# Patient Record
Sex: Male | Born: 1935 | Race: White | Hispanic: No | Marital: Married | Smoking: Former smoker
Health system: Southern US, Community
[De-identification: ages and names within clinical notes are randomized; demographics above are authoritative.]

## PROBLEM LIST (undated history)

## (undated) DIAGNOSIS — S82899A Other fracture of unspecified lower leg, initial encounter for closed fracture: Secondary | ICD-10-CM

## (undated) DIAGNOSIS — S8291XA Unspecified fracture of right lower leg, initial encounter for closed fracture: Secondary | ICD-10-CM

## (undated) DIAGNOSIS — K469 Unspecified abdominal hernia without obstruction or gangrene: Secondary | ICD-10-CM

## (undated) DIAGNOSIS — E669 Obesity, unspecified: Secondary | ICD-10-CM

## (undated) DIAGNOSIS — Z7901 Long term (current) use of anticoagulants: Secondary | ICD-10-CM

## (undated) DIAGNOSIS — N183 Chronic kidney disease, stage 3 unspecified: Secondary | ICD-10-CM

## (undated) DIAGNOSIS — I10 Essential (primary) hypertension: Secondary | ICD-10-CM

## (undated) DIAGNOSIS — H409 Unspecified glaucoma: Secondary | ICD-10-CM

## (undated) DIAGNOSIS — E785 Hyperlipidemia, unspecified: Secondary | ICD-10-CM

## (undated) DIAGNOSIS — G4733 Obstructive sleep apnea (adult) (pediatric): Secondary | ICD-10-CM

## (undated) DIAGNOSIS — E1129 Type 2 diabetes mellitus with other diabetic kidney complication: Secondary | ICD-10-CM

## (undated) DIAGNOSIS — Z9889 Other specified postprocedural states: Secondary | ICD-10-CM

## (undated) DIAGNOSIS — H3530 Unspecified macular degeneration: Secondary | ICD-10-CM

## (undated) DIAGNOSIS — R112 Nausea with vomiting, unspecified: Secondary | ICD-10-CM

## (undated) DIAGNOSIS — IMO0002 Reserved for concepts with insufficient information to code with codable children: Secondary | ICD-10-CM

## (undated) DIAGNOSIS — I82409 Acute embolism and thrombosis of unspecified deep veins of unspecified lower extremity: Secondary | ICD-10-CM

## (undated) DIAGNOSIS — M1990 Unspecified osteoarthritis, unspecified site: Secondary | ICD-10-CM

## (undated) HISTORY — DX: Chronic kidney disease, stage 3 (moderate): N18.3

## (undated) HISTORY — DX: Reserved for concepts with insufficient information to code with codable children: IMO0002

## (undated) HISTORY — DX: Acute embolism and thrombosis of unspecified deep veins of unspecified lower extremity: I82.409

## (undated) HISTORY — DX: Essential (primary) hypertension: I10

## (undated) HISTORY — DX: Type 2 diabetes mellitus with other diabetic kidney complication: E11.29

## (undated) HISTORY — DX: Hyperlipidemia, unspecified: E78.5

## (undated) HISTORY — DX: Unspecified glaucoma: H40.9

## (undated) HISTORY — DX: Other fracture of unspecified lower leg, initial encounter for closed fracture: S82.899A

## (undated) HISTORY — DX: Long term (current) use of anticoagulants: Z79.01

## (undated) HISTORY — DX: Chronic kidney disease, stage 3 unspecified: N18.30

## (undated) HISTORY — DX: Obstructive sleep apnea (adult) (pediatric): G47.33

## (undated) HISTORY — DX: Obesity, unspecified: E66.9

## (undated) HISTORY — DX: Unspecified fracture of right lower leg, initial encounter for closed fracture: S82.91XA

## (undated) HISTORY — PX: EYE SURGERY: SHX253

---

## 1967-05-02 HISTORY — PX: APPENDECTOMY: SHX54

## 1999-02-07 ENCOUNTER — Emergency Department (HOSPITAL_COMMUNITY): Admission: EM | Admit: 1999-02-07 | Discharge: 1999-02-07 | Payer: Self-pay | Admitting: Emergency Medicine

## 2000-01-16 ENCOUNTER — Encounter: Admission: RE | Admit: 2000-01-16 | Discharge: 2000-04-15 | Payer: Self-pay | Admitting: Family Medicine

## 2000-09-14 ENCOUNTER — Encounter (INDEPENDENT_AMBULATORY_CARE_PROVIDER_SITE_OTHER): Payer: Self-pay | Admitting: *Deleted

## 2000-09-14 ENCOUNTER — Ambulatory Visit (HOSPITAL_COMMUNITY): Admission: RE | Admit: 2000-09-14 | Discharge: 2000-09-14 | Payer: Self-pay | Admitting: Gastroenterology

## 2002-10-16 ENCOUNTER — Encounter: Admission: RE | Admit: 2002-10-16 | Discharge: 2003-01-14 | Payer: Self-pay | Admitting: Family Medicine

## 2004-09-02 ENCOUNTER — Encounter: Admission: RE | Admit: 2004-09-02 | Discharge: 2004-09-02 | Payer: Self-pay | Admitting: Family Medicine

## 2005-05-09 ENCOUNTER — Encounter: Admission: RE | Admit: 2005-05-09 | Discharge: 2005-05-09 | Payer: Self-pay | Admitting: Family Medicine

## 2006-05-01 HISTORY — PX: PARTIAL HIP ARTHROPLASTY: SHX733

## 2007-04-03 ENCOUNTER — Inpatient Hospital Stay (HOSPITAL_COMMUNITY): Admission: RE | Admit: 2007-04-03 | Discharge: 2007-04-08 | Payer: Self-pay | Admitting: Orthopedic Surgery

## 2007-08-29 ENCOUNTER — Encounter: Admission: RE | Admit: 2007-08-29 | Discharge: 2007-08-29 | Payer: Self-pay | Admitting: Orthopedic Surgery

## 2009-03-12 ENCOUNTER — Encounter: Admission: RE | Admit: 2009-03-12 | Discharge: 2009-03-12 | Payer: Self-pay | Admitting: Orthopedic Surgery

## 2010-09-13 NOTE — Op Note (Signed)
NAME:  Brian [name], Brian [name] NO.:  [id number]   MEDICAL RECORD NO.:  [id number]          PATIENT TYPE:  INP   LOCATION:  0003                         FACILITY:  [facility]   PHYSICIAN:  Ollen Gross, M.D.    DATE OF BIRTH:  05-29-1930   DATE OF PROCEDURE:  04/03/2007  DATE OF DISCHARGE:                               OPERATIVE REPORT   PREOPERATIVE DIAGNOSIS:  Osteoarthritis, right hip.   POSTOPERATIVE DIAGNOSIS:  Osteoarthritis, right hip.   PROCEDURE:  Right total hip arthroplasty.   SURGEON:  Ollen Gross, M.D.   ASSISTANT:  Avel Peace, PA-C   ANESTHESIA:  Spinal.   ESTIMATED BLOOD LOSS:  300 mL.   DRAINS:  Hemovac times one.   COMPLICATIONS:  None.   CONDITION:  Stable to recovery.   BRIEF CLINICAL NOTE:  Brian [name] is a 77 year old male who has end-  stage arthritis of the right hip with progressively worsening pain and  dysfunction.  He has failed nonoperative management and presents for  total hip arthroplasty.   PROCEDURE IN DETAIL:  After successful administration of spinal  anesthetic, the patient is placed in the left lateral decubitus position  with the right side up and held with his hip positioner.  Right lower  extremity isolated from his perineum with plastic drapes and prepped and  draped in the usual sterile fashion.  Short posterolateral incision is  made with a 10 blade through subcutaneous tissue to the level of fascia  lata which was incised in line with the skin incision.  Sciatic nerve  was palpated and protected and short external rotators isolated off the  femur.  Capsulectomy is performed and the hip was dislocated.  Center of  femoral head is marked and a trial prosthesis placed such that the  center of trial head corresponds to center of his native femoral head.  Osteotomy line is marked on the femoral neck and osteotomy made with an  oscillating saw.  Femoral head removed and the femur was retracted  anteriorly to gain  acetabular exposure.   Acetabular retractors were placed in labrum and a large rim osteophytes  were removed.  Reaming starts at 45 mm coursing in increments of 2 to 53  mm and a 54 mm Pinnacle acetabular shell was placed in anatomic position  and transfixed with two dome screws.  The trial 32-mm neutral +4 liner  was placed.   Femur is prepared with the canal finder and irrigation.  Axial reaming  is performed up to 13.5 mm proximal reaming to 66F and the sleeve  machined to a small.  18 F small trial sleeve is placed and 18 x 13 stem  and 36 plus 8 neck about 10 degrees beyond native anteversion.  32.0  head is placed.  It reduced too easily and there was not enough offset.  Went to a 36 plus 12 neck which had more appropriate offset.  Again we  placed it 10 degrees beyond native anteversion.  The 32.0 head is placed  and the hips reduced with excellent stability.  There is full extension,  full external  rotation, 70 degrees flexion, 40 degrees adduction about  70 degrees internal rotation and 90 degrees of flexion and about 70  degrees internal rotation.  By placing the right leg on top of the left  it felt as though the leg lengths were equal.  Hip was then dislocated  and trials removed.  Permanent apex hole eliminator and permanent 32 mm  neutral +4 marathon liner placed.  On the femoral side, we placed the  31F small sleeve with 18 x 13 stem, 36 plus 8 neck 10 degrees beyond  native anteversion.  The stem was impacted and the 32.0 femoral head is  placed.  The hip was reduced ti same stability parameters.  Wound was  irrigated with saline solution and then the short rotators reattached to  the femur through drill holes.  Fascia lata was closed over Hemovac  drain with interrupted #1 Vicryl, subcu closed with #1-0 and #2-0 Vicryl  and subcuticular running 4-0 Monocryl.  The incisions cleaned and dried  and Steri-Strips and a bulky sterile dressing applied.  Drains hooked to   suction and he was placed into a knee immobilizer, awakened and  transferred to recovery in stable condition.      Ollen Gross, M.D.  Electronically Signed     FA/MEDQ  D:  04/03/2007  T:  04/03/2007  Job:  [id number]

## 2010-09-13 NOTE — H&P (Signed)
NAME:  Brian [name], Brian [name] NO.:  [id number]   MEDICAL RECORD NO.:  [id number]          PATIENT TYPE:  INP   LOCATION:  1612                         FACILITY:  [facility]   PHYSICIAN:  Ollen Gross, M.D.    DATE OF BIRTH:  [birth year]   DATE OF ADMISSION:  04/03/2007  DATE OF DISCHARGE:                              HISTORY & PHYSICAL   DATE OF OFFICE VISIT/HISTORY AND PHYSICAL:  March 08, 2007.   CHIEF COMPLAINT:  Right hip pain.   HISTORY OF PRESENT ILLNESS:  The patient is a 77 year old male who has  been seen by Dr. Lequita Halt for ongoing right hip pain as a second opinion.  He has been told in the past he needed a hip replacement due to severe  arthritis.  He has been followed by Dr. Chaney Malling in the past.  He was  seen in the office by Dr. Lequita Halt and x-rays showed end-stage arthritis  of the right hip with bone-on-bone and osteophyte formation.  It is felt  he has reached a point where he could benefit from undergoing hip  replacement.  Risks and benefits have been discussed, and he elects to  proceed with surgery.   ALLERGIES:  PENICILLIN CAUSES RASH.   CURRENT MEDICATIONS:  Tricor, warfarin, lisinopril, metformin,  simvastatin, allopurinol, Lumigan eye drops, Alphagan eye drops and  PreserVision.   PAST MEDICAL HISTORY:  Hypertension, macular degeneration, left eye  blindness, gout, hypercholesterolemia, history of DVT and non-insulin-  dependent diabetes.   PAST MEDICAL AND SURGICAL HISTORY:  Pilonidal cyst surgery and ruptured  appendix surgery.   SOCIAL HISTORY:  Married.  Retired from [employer].  Quit smoking about 40  years ago.  No alcohol.  Two children.  Wife and daughter will be  assisting with care after surgery.   FAMILY HISTORY:  Father deceased at age 37 of heart disease.  Mother  deceased at age 66 with hypertension and stroke.  Daughter with  diabetes.   REVIEW OF SYSTEMS:  GENERAL:  No fevers, chills, night sweats.  NEURO:  Macular  degeneration with left eye blindness.  He does have some  peripheral vision.  No seizures, syncope.  RESPIRATORY:  No shortness of  breath, productive cough or hemoptysis.  CARDIOVASCULAR:  No chest pain  or orthopnea.  GI:  No nausea, vomiting, diarrhea  or constipation.  GU:  No dysuria, hematuria or discharge.  MUSCULOSKELETAL:  Right hip.   PHYSICAL EXAMINATION:  VITAL SIGNS:  Pulse 78, respirations 14, blood  pressure 132/54.  GENERAL:  A 77 year old white male, well-nourished, well-developed, in  no acute distress.  He is alert, oriented, cooperative, pleasant.  HEENT:  Normocephalic, atraumatic.  Pupils are round and reactive.  Does  not wear glasses.  Decreased vision in the left eye.  He does have  bilateral hearing aids.  NECK:  Supple.  CHEST:  Clear.  HEART:  Regular rate and rhythm with a grade 2/6 early systolic ejection  murmur, best heard over pulmonic point.  ABDOMEN:  Soft, round, protuberant abdomen.  Bowel sounds present.  BREASTS/GENITALIA:  Not done, not pertinent to present  illness.  EXTREMITIES:  Right hip flexion 90, abduction 10 to 20 degrees, 0  internal rotation, 10 degrees external rotation.   IMPRESSION:  1. Osteoarthritis, right hip.  2. Macular degeneration with left eye blindness.  3. Gout.  4. Hypercholesterolemia.  5. History of deep venous thrombosis (no pulmonary embolism).  6. Hypertension.  7. Non-insulin-dependent diabetes mellitus.   PLAN:  The patient admitted to [facility] to undergo a right  total hip replacement arthroplasty.  Surgery will be performed by Dr.  Ollen Gross.      Alexzandrew L. Perkins, P.A.C.      Ollen Gross, M.D.  Electronically Signed    ALP/MEDQ  D:  04/03/2007  T:  04/04/2007  Job:  [id number]   cc:   Kari Baars, M.D.  Fax: [phone]   Ollen Gross, M.D.  Fax: [phone]

## 2010-09-16 NOTE — Procedures (Signed)
[facility]  Patient:    Brian [name], Brian [name]                      MRN: [id number] Proc. Date: 09/14/00 Adm. Date:  [id number] Attending:  Rich Brave CC:         Redmond Baseman, M.D.   Procedure Report  PROCEDURE:  Colonoscopy with biopsies.  INDICATIONS FOR PROCEDURE:  Follow-up of two colonic adenomas removed at the time of colonoscopy three years ago, at which time he had been Hemoccult positive.  FINDINGS:  Three diminutive polyps, cold biopsied.  DESCRIPTION OF PROCEDURE:  The nature, purpose and risk of the procedure were familiar to the patient from prior examination and he provided written consent.  This patient is maintained on Coumadin because of a remote history of lower extremity DVTs. He has been on Coumadin for about the past five years. Although I believe he was taken off Coumadin for his previous exam, I elected to leave him on the Coumadin for this exam. A pro time this morning was 24 seconds with an INR of 3.0.  The Olympus adult video colonoscope was easily advanced to the cecum as identified by visualization of the appendiceal orifice. Pullback was then performed. The quality of the prep was excellent and it is felt that all areas were well seen.  There were three small sessile 2-3 mm adenomas scattered in the left colon, two at approximately 58 cm, one at approximately 48 cm. Each was removed by a single cold biopsy, with minimal transient oozing.  No large polyps, cancer, colitis, or vascular malformations were observed and retroflexion in the rectum was unremarkable.  The patient tolerated the procedure well and there were no apparent complications.  IMPRESSION:  Small colon polyps removed as described above. No evidence of prolonged or persistent bleeding while on Coumadin.  PLAN:  Await pathology on todays biopsies. Follow-up in three to five years depending on the histologic findings. The patient may  remain on his Coumadin following this procedure. DD:  09/14/00 TD:  09/15/00 Job: [id number]

## 2010-09-16 NOTE — Discharge Summary (Signed)
NAME:  MARQUICE, UDDIN NO.:  [id number]   MEDICAL RECORD NO.:  [id number]          PATIENT TYPE:  INP   LOCATION:  1612                         FACILITY:  [facility]   PHYSICIAN:  Ollen Gross, M.D.    DATE OF BIRTH:  1931/03/02   DATE OF ADMISSION:  04/03/2007  DATE OF DISCHARGE:  04/08/2007                               DISCHARGE SUMMARY   ADMITTING DIAGNOSIS:  1. Osteoarthritis, right hip.  2. Macular degeneration with left eye blindness.  3. Gout.  4. Hypercholesterolemia.  5. History of deep vein thrombosis (no pulmonary embolus).  6. Hypertension.  7. Non-insulin-dependent diabetes mellitus.   DISCHARGE DIAGNOSIS:  1. Osteoarthritis, right hip, status post right total hip      arthroplasty.  2. Mild postoperative blood loss anemia, did not require transfusion.  3. Mild postoperative hyponatremia, improving.  4. Macular degeneration with left eye blindness.  5. Gout.  6. Hypercholesterolemia.  7. History of deep vein thrombosis (no pulmonary embolus).  8. Hypertension.  9. Non-insulin-dependent diabetes mellitus.   PROCEDURE:  April 03, 2007, right total hip.   SURGEON:  Dr. Lequita Halt.   ASSISTANT:  Greggory Brandy.   CONSULTS:  Dr. Eric Form, medicine.   BRIEF HISTORY:  Mr. Delpilar is a 77 year old male with end-stage  arthritis of the right hip, progressive worsening pain and dysfunction,  failed operative management, now presents for a total knee arthroplasty.   LABORATORY DATA:  Preop CBC:  hemoglobin 12.5, hematocrit 37, white cell  count 6.4, hemoglobin 10.6.  Postop direct down to  10.2.  Last H&H 10.1  of 29.5.  PT/PTT preop 13.1 and 54 respectively.  INR was 1.  Serial pro  times followed.  PT and INR 21.7 and 1.8.  Chem panel on admission all  within normal limits.  Serial BMETs were followed.  Electrolytes showed  a sodium of 141, dropped down to 134, back up to 135.  Glucose went up  from 114 to 159,  back down to 139.  Preop UA negative.   Blood group  type O positive.   Portable pelvis and hip films April 03, 2007, right total hip  arthroplasty without immediate complicating features, has a preop EKG  dated March 05, 2007.  Normal sinus rhythm.  Normal EKG, confirmed by  Dr. Clelia Croft.   HOSPITAL COURSE:  Patient admitted to [facility].  Tolerated  procedure well, later transferred to recovery room, orthopedic floor.  Started on PCA and p.o. analgesics.  Had very good output on the morning  of day 1.  Hemovac drain placed at the time of surgery was pulled  without difficulty.  Discontinued the PCA later that afternoon.  Had a  little bit of low sodium, so we reduced the fluids and rechecked that.  Was on lisinopril and held that postoperatively.  Had a fair amount of  pain on day 1, was a little bit better on the morning of day 2.  Patient  was seen by Dr. Clelia Croft from a medical standpoint, and felt that the  patient was doing well.  Hypertension was controlled.  Agreed  with  holding the lisinopril and watching the creatinine postoperatively.  Continue to follow during the hospital course.  Medical assistance was  greatly appreciated.  Incision was healing well on day 2.  Continued on  Coumadin and Lovenox postop due to his past history of DVT.  Hypertension remained stable postop.  Continued to progress well.  He  was slowly progressing with physical therapy.  Only did about 25 feet by  day 2, but then slowly continued to progress with therapy through the  weekend.  Hypertension remained stable.  Continued to encourage patient  with therapy.  He was slowly progressing for a couple of days and needed  extra encouragement and assistance.  Once the patient did improve with  his mobility and his pain control, he was ready to go home by April 08, 2007.   PLAN:  1. The patient was discharged home on April 08, 2007.  2. Discharge diagnosis, please see above.  3. Discharge meds:  Coumadin, Percocet, Robaxin.    FOLLOWUP:  2 weeks.   ACTIVITY:  Partial weightbearing 25%, 50% right lower extremity.  Home  health PT, home health nursing.   DISPOSITION:  Home.   CONDITION ON DISCHARGE:  Improving.      Alexzandrew L. Perkins, P.A.C.      Ollen Gross, M.D.  Electronically Signed    ALP/MEDQ  D:  04/30/2007  T:  04/30/2007  Job:  [id number]   cc:   Ollen Gross, M.D.  Fax: [phone]   Kari Baars, M.D.  Fax: [phone]

## 2011-02-06 LAB — CBC
HCT: 29.5 — ABNORMAL LOW
Hemoglobin: 10.6 — ABNORMAL LOW
MCHC: 34.1
MCHC: 34.6
MCV: 83.6
MCV: 84
Platelets: 135 — ABNORMAL LOW
Platelets: 136 — ABNORMAL LOW
Platelets: 151
RDW: 14.1
RDW: 14.4
RDW: 14.4
WBC: 9.1

## 2011-02-06 LAB — BASIC METABOLIC PANEL
BUN: 14
BUN: 14
CO2: 26
CO2: 26
Chloride: 101
Creatinine, Ser: 1.39
GFR calc Af Amer: 55 — ABNORMAL LOW
GFR calc non Af Amer: 46 — ABNORMAL LOW
GFR calc non Af Amer: 53 — ABNORMAL LOW
Glucose, Bld: 139 — ABNORMAL HIGH
Glucose, Bld: 158 — ABNORMAL HIGH
Potassium: 4.1
Potassium: 4.2
Potassium: 4.2
Sodium: 134 — ABNORMAL LOW
Sodium: 135

## 2011-02-06 LAB — TYPE AND SCREEN

## 2011-02-06 LAB — PROTIME-INR
INR: 1
Prothrombin Time: 14.9
Prothrombin Time: 21.2 — ABNORMAL HIGH

## 2011-02-07 LAB — CBC
Hemoglobin: 12.5 — ABNORMAL LOW
MCHC: 33.6
MCV: 84.7
RBC: 4.37

## 2011-02-07 LAB — COMPREHENSIVE METABOLIC PANEL
AST: 12
Albumin: 3.9
Alkaline Phosphatase: 48
BUN: 17
GFR calc Af Amer: 58 — ABNORMAL LOW
Potassium: 4.2
Sodium: 141
Total Protein: 6.5

## 2011-02-07 LAB — URINALYSIS, ROUTINE W REFLEX MICROSCOPIC
Bilirubin Urine: NEGATIVE
Hgb urine dipstick: NEGATIVE
Specific Gravity, Urine: 1.017
pH: 6.5

## 2011-05-02 HISTORY — PX: CATARACT EXTRACTION: SUR2

## 2011-06-30 ENCOUNTER — Encounter (INDEPENDENT_AMBULATORY_CARE_PROVIDER_SITE_OTHER): Payer: Self-pay | Admitting: Ophthalmology

## 2011-06-30 DIAGNOSIS — H2510 Age-related nuclear cataract, unspecified eye: Secondary | ICD-10-CM

## 2011-06-30 DIAGNOSIS — H3530 Unspecified macular degeneration: Secondary | ICD-10-CM

## 2011-06-30 DIAGNOSIS — H43819 Vitreous degeneration, unspecified eye: Secondary | ICD-10-CM

## 2011-06-30 DIAGNOSIS — E11319 Type 2 diabetes mellitus with unspecified diabetic retinopathy without macular edema: Secondary | ICD-10-CM

## 2011-06-30 DIAGNOSIS — E1165 Type 2 diabetes mellitus with hyperglycemia: Secondary | ICD-10-CM

## 2011-07-05 ENCOUNTER — Encounter (INDEPENDENT_AMBULATORY_CARE_PROVIDER_SITE_OTHER): Payer: Medicare Other | Admitting: Ophthalmology

## 2011-07-05 DIAGNOSIS — H2510 Age-related nuclear cataract, unspecified eye: Secondary | ICD-10-CM

## 2011-07-05 DIAGNOSIS — E11319 Type 2 diabetes mellitus with unspecified diabetic retinopathy without macular edema: Secondary | ICD-10-CM

## 2011-07-05 DIAGNOSIS — H353290 Exudative age-related macular degeneration, unspecified eye, stage unspecified: Secondary | ICD-10-CM

## 2011-07-05 DIAGNOSIS — E1139 Type 2 diabetes mellitus with other diabetic ophthalmic complication: Secondary | ICD-10-CM

## 2011-07-05 DIAGNOSIS — H43819 Vitreous degeneration, unspecified eye: Secondary | ICD-10-CM

## 2011-07-05 DIAGNOSIS — H3530 Unspecified macular degeneration: Secondary | ICD-10-CM

## 2011-07-28 ENCOUNTER — Encounter (INDEPENDENT_AMBULATORY_CARE_PROVIDER_SITE_OTHER): Payer: Medicare Other | Admitting: Ophthalmology

## 2011-07-28 DIAGNOSIS — E1139 Type 2 diabetes mellitus with other diabetic ophthalmic complication: Secondary | ICD-10-CM

## 2011-07-28 DIAGNOSIS — H353290 Exudative age-related macular degeneration, unspecified eye, stage unspecified: Secondary | ICD-10-CM

## 2011-07-28 DIAGNOSIS — H43819 Vitreous degeneration, unspecified eye: Secondary | ICD-10-CM

## 2011-07-28 DIAGNOSIS — H2510 Age-related nuclear cataract, unspecified eye: Secondary | ICD-10-CM

## 2011-07-28 DIAGNOSIS — E11319 Type 2 diabetes mellitus with unspecified diabetic retinopathy without macular edema: Secondary | ICD-10-CM

## 2011-07-28 DIAGNOSIS — H3530 Unspecified macular degeneration: Secondary | ICD-10-CM

## 2011-08-25 ENCOUNTER — Encounter (INDEPENDENT_AMBULATORY_CARE_PROVIDER_SITE_OTHER): Payer: Medicare Other | Admitting: Ophthalmology

## 2011-08-25 DIAGNOSIS — H3530 Unspecified macular degeneration: Secondary | ICD-10-CM

## 2011-08-25 DIAGNOSIS — H353290 Exudative age-related macular degeneration, unspecified eye, stage unspecified: Secondary | ICD-10-CM

## 2011-09-19 ENCOUNTER — Encounter (INDEPENDENT_AMBULATORY_CARE_PROVIDER_SITE_OTHER): Payer: Self-pay | Admitting: Ophthalmology

## 2011-09-27 ENCOUNTER — Encounter (INDEPENDENT_AMBULATORY_CARE_PROVIDER_SITE_OTHER): Payer: Medicare Other | Admitting: Ophthalmology

## 2011-09-27 DIAGNOSIS — I10 Essential (primary) hypertension: Secondary | ICD-10-CM

## 2011-09-27 DIAGNOSIS — H3530 Unspecified macular degeneration: Secondary | ICD-10-CM

## 2011-09-27 DIAGNOSIS — H43819 Vitreous degeneration, unspecified eye: Secondary | ICD-10-CM

## 2011-09-27 DIAGNOSIS — H2510 Age-related nuclear cataract, unspecified eye: Secondary | ICD-10-CM

## 2011-09-27 DIAGNOSIS — H353290 Exudative age-related macular degeneration, unspecified eye, stage unspecified: Secondary | ICD-10-CM

## 2011-09-27 DIAGNOSIS — H35039 Hypertensive retinopathy, unspecified eye: Secondary | ICD-10-CM

## 2011-11-01 ENCOUNTER — Encounter (INDEPENDENT_AMBULATORY_CARE_PROVIDER_SITE_OTHER): Payer: Medicare Other | Admitting: Ophthalmology

## 2011-11-01 DIAGNOSIS — H43819 Vitreous degeneration, unspecified eye: Secondary | ICD-10-CM

## 2011-11-01 DIAGNOSIS — H353290 Exudative age-related macular degeneration, unspecified eye, stage unspecified: Secondary | ICD-10-CM

## 2011-11-01 DIAGNOSIS — H3530 Unspecified macular degeneration: Secondary | ICD-10-CM

## 2011-11-01 DIAGNOSIS — H35039 Hypertensive retinopathy, unspecified eye: Secondary | ICD-10-CM

## 2011-11-01 DIAGNOSIS — H35439 Paving stone degeneration of retina, unspecified eye: Secondary | ICD-10-CM

## 2011-12-13 ENCOUNTER — Encounter (INDEPENDENT_AMBULATORY_CARE_PROVIDER_SITE_OTHER): Payer: Medicare Other | Admitting: Ophthalmology

## 2011-12-13 DIAGNOSIS — H3530 Unspecified macular degeneration: Secondary | ICD-10-CM

## 2011-12-13 DIAGNOSIS — I10 Essential (primary) hypertension: Secondary | ICD-10-CM

## 2011-12-13 DIAGNOSIS — H43819 Vitreous degeneration, unspecified eye: Secondary | ICD-10-CM

## 2011-12-13 DIAGNOSIS — H35039 Hypertensive retinopathy, unspecified eye: Secondary | ICD-10-CM

## 2011-12-13 DIAGNOSIS — H353290 Exudative age-related macular degeneration, unspecified eye, stage unspecified: Secondary | ICD-10-CM

## 2011-12-13 DIAGNOSIS — H4010X0 Unspecified open-angle glaucoma, stage unspecified: Secondary | ICD-10-CM

## 2012-01-24 ENCOUNTER — Encounter (INDEPENDENT_AMBULATORY_CARE_PROVIDER_SITE_OTHER): Payer: Medicare Other | Admitting: Ophthalmology

## 2012-01-24 DIAGNOSIS — H35039 Hypertensive retinopathy, unspecified eye: Secondary | ICD-10-CM

## 2012-01-24 DIAGNOSIS — I10 Essential (primary) hypertension: Secondary | ICD-10-CM

## 2012-01-24 DIAGNOSIS — H43819 Vitreous degeneration, unspecified eye: Secondary | ICD-10-CM

## 2012-01-24 DIAGNOSIS — H3530 Unspecified macular degeneration: Secondary | ICD-10-CM

## 2012-01-24 DIAGNOSIS — H353290 Exudative age-related macular degeneration, unspecified eye, stage unspecified: Secondary | ICD-10-CM

## 2012-02-07 ENCOUNTER — Ambulatory Visit: Payer: Medicare Other | Attending: Orthopedic Surgery | Admitting: Physical Therapy

## 2012-02-07 DIAGNOSIS — M25559 Pain in unspecified hip: Secondary | ICD-10-CM | POA: Insufficient documentation

## 2012-02-07 DIAGNOSIS — M545 Low back pain, unspecified: Secondary | ICD-10-CM | POA: Insufficient documentation

## 2012-02-07 DIAGNOSIS — M2569 Stiffness of other specified joint, not elsewhere classified: Secondary | ICD-10-CM | POA: Insufficient documentation

## 2012-02-07 DIAGNOSIS — IMO0001 Reserved for inherently not codable concepts without codable children: Secondary | ICD-10-CM | POA: Insufficient documentation

## 2012-02-13 ENCOUNTER — Ambulatory Visit: Payer: Medicare Other | Admitting: Physical Therapy

## 2012-02-19 ENCOUNTER — Ambulatory Visit: Payer: Medicare Other | Admitting: Physical Therapy

## 2012-02-20 ENCOUNTER — Other Ambulatory Visit (HOSPITAL_COMMUNITY): Payer: Self-pay | Admitting: Orthopedic Surgery

## 2012-02-20 DIAGNOSIS — M25551 Pain in right hip: Secondary | ICD-10-CM

## 2012-02-26 ENCOUNTER — Ambulatory Visit: Payer: Medicare Other | Admitting: Physical Therapy

## 2012-02-28 ENCOUNTER — Encounter (HOSPITAL_COMMUNITY)
Admission: RE | Admit: 2012-02-28 | Discharge: 2012-02-28 | Disposition: A | Discharge status: Home / Self Care | Payer: Medicare Other | Source: Ambulatory Visit | Attending: Orthopedic Surgery | Admitting: Orthopedic Surgery

## 2012-02-28 DIAGNOSIS — M25559 Pain in unspecified hip: Secondary | ICD-10-CM | POA: Insufficient documentation

## 2012-02-28 DIAGNOSIS — Z96649 Presence of unspecified artificial hip joint: Secondary | ICD-10-CM | POA: Insufficient documentation

## 2012-02-28 DIAGNOSIS — M25551 Pain in right hip: Secondary | ICD-10-CM

## 2012-02-28 MED ORDER — TECHNETIUM TC 99M MEDRONATE IV KIT
25.0000 | PACK | Freq: Once | INTRAVENOUS | Status: AC | PRN
  Administered 2012-02-28: 24 via INTRAVENOUS

## 2012-03-04 ENCOUNTER — Ambulatory Visit: Payer: Medicare Other | Attending: Orthopedic Surgery | Admitting: Physical Therapy

## 2012-03-04 DIAGNOSIS — M545 Low back pain, unspecified: Secondary | ICD-10-CM | POA: Insufficient documentation

## 2012-03-04 DIAGNOSIS — IMO0001 Reserved for inherently not codable concepts without codable children: Secondary | ICD-10-CM | POA: Insufficient documentation

## 2012-03-04 DIAGNOSIS — M2569 Stiffness of other specified joint, not elsewhere classified: Secondary | ICD-10-CM | POA: Insufficient documentation

## 2012-03-04 DIAGNOSIS — M25559 Pain in unspecified hip: Secondary | ICD-10-CM | POA: Insufficient documentation

## 2012-03-06 ENCOUNTER — Encounter: Payer: Medicare Other | Admitting: Physical Therapy

## 2012-03-11 ENCOUNTER — Ambulatory Visit: Payer: Medicare Other | Admitting: Physical Therapy

## 2012-03-13 ENCOUNTER — Encounter (INDEPENDENT_AMBULATORY_CARE_PROVIDER_SITE_OTHER): Payer: Medicare Other | Admitting: Ophthalmology

## 2012-03-13 DIAGNOSIS — I10 Essential (primary) hypertension: Secondary | ICD-10-CM

## 2012-03-13 DIAGNOSIS — H353290 Exudative age-related macular degeneration, unspecified eye, stage unspecified: Secondary | ICD-10-CM

## 2012-03-13 DIAGNOSIS — H35039 Hypertensive retinopathy, unspecified eye: Secondary | ICD-10-CM

## 2012-03-13 DIAGNOSIS — H3530 Unspecified macular degeneration: Secondary | ICD-10-CM

## 2012-03-13 DIAGNOSIS — H43819 Vitreous degeneration, unspecified eye: Secondary | ICD-10-CM

## 2012-03-18 ENCOUNTER — Ambulatory Visit: Payer: Medicare Other | Admitting: Physical Therapy

## 2012-05-15 ENCOUNTER — Encounter (INDEPENDENT_AMBULATORY_CARE_PROVIDER_SITE_OTHER): Payer: Medicare Other | Admitting: Ophthalmology

## 2012-05-15 DIAGNOSIS — H3530 Unspecified macular degeneration: Secondary | ICD-10-CM

## 2012-05-15 DIAGNOSIS — H43819 Vitreous degeneration, unspecified eye: Secondary | ICD-10-CM

## 2012-05-15 DIAGNOSIS — I10 Essential (primary) hypertension: Secondary | ICD-10-CM

## 2012-05-15 DIAGNOSIS — H353290 Exudative age-related macular degeneration, unspecified eye, stage unspecified: Secondary | ICD-10-CM

## 2012-05-15 DIAGNOSIS — H35039 Hypertensive retinopathy, unspecified eye: Secondary | ICD-10-CM

## 2012-07-04 ENCOUNTER — Encounter (INDEPENDENT_AMBULATORY_CARE_PROVIDER_SITE_OTHER): Payer: Medicare Other | Admitting: Ophthalmology

## 2012-07-04 DIAGNOSIS — H35039 Hypertensive retinopathy, unspecified eye: Secondary | ICD-10-CM

## 2012-07-04 DIAGNOSIS — H43819 Vitreous degeneration, unspecified eye: Secondary | ICD-10-CM

## 2012-07-31 ENCOUNTER — Encounter (INDEPENDENT_AMBULATORY_CARE_PROVIDER_SITE_OTHER): Payer: Medicare Other | Admitting: Ophthalmology

## 2012-09-12 ENCOUNTER — Encounter (INDEPENDENT_AMBULATORY_CARE_PROVIDER_SITE_OTHER): Payer: Medicare Other | Admitting: Ophthalmology

## 2012-09-12 DIAGNOSIS — H3530 Unspecified macular degeneration: Secondary | ICD-10-CM

## 2012-09-12 DIAGNOSIS — I10 Essential (primary) hypertension: Secondary | ICD-10-CM

## 2012-09-12 DIAGNOSIS — H35039 Hypertensive retinopathy, unspecified eye: Secondary | ICD-10-CM

## 2012-09-12 DIAGNOSIS — H43819 Vitreous degeneration, unspecified eye: Secondary | ICD-10-CM

## 2012-09-12 DIAGNOSIS — H353290 Exudative age-related macular degeneration, unspecified eye, stage unspecified: Secondary | ICD-10-CM

## 2012-11-11 ENCOUNTER — Ambulatory Visit (INDEPENDENT_AMBULATORY_CARE_PROVIDER_SITE_OTHER): Payer: Self-pay | Admitting: Neurology

## 2012-11-11 ENCOUNTER — Encounter: Payer: Self-pay | Admitting: Neurology

## 2012-11-11 VITALS — BP 111/59 | HR 66 | Temp 97.5°F | Ht 67.0 in | Wt 216.0 lb

## 2012-11-11 DIAGNOSIS — R400 Somnolence: Secondary | ICD-10-CM

## 2012-11-11 DIAGNOSIS — M25559 Pain in unspecified hip: Secondary | ICD-10-CM

## 2012-11-11 DIAGNOSIS — R0683 Snoring: Secondary | ICD-10-CM | POA: Insufficient documentation

## 2012-11-11 DIAGNOSIS — E669 Obesity, unspecified: Secondary | ICD-10-CM

## 2012-11-11 DIAGNOSIS — R0609 Other forms of dyspnea: Secondary | ICD-10-CM

## 2012-11-11 DIAGNOSIS — G4710 Hypersomnia, unspecified: Secondary | ICD-10-CM

## 2012-11-11 DIAGNOSIS — M25551 Pain in right hip: Secondary | ICD-10-CM | POA: Insufficient documentation

## 2012-11-11 NOTE — Patient Instructions (Addendum)

## 2012-11-11 NOTE — Progress Notes (Signed)
Subjective:    Brian [name] ID: Brian Brian [name] is a 79 y.o. male.  HPI  Brian Foley, MD, PhD [facility] [address] [address] [place], [place] [address]   Dear Dr. Clelia Croft,  I saw your Brian [name], Brian Brian [name], upon your kind request in my neurologic clinic today for initial consultation of his sleep disorder, in particular concern for obstructive sleep apnea. Brian Brian [name] is unaccompanied today. As you know, Brian Brian [name] is a very pleasant 79 year old right-handed gentleman with an underlying medical history of diabetes, hypertension, hyperlipidemia, gout, macular degeneration, DVT twice with lifelong anticoagulation, glaucoma, and osteoarthritis who may need to proceed with orthopedic surgery for his R hip problems. He had a THR some 5 years ago on Brian R.  His current medications are tramadol, tradjenta, allopurinol, Alphagan, lisinopril, simvastatin, warfarin, fenofibrate, amlodipine, Niaspan, Allegra, Claritin, acetaminophen. He apparently had an overnight pulse oximetry test some 6 months ago, which per wife was abnormal, but I do not have details on that.   His typical bedtime is reported to be around 10 or 10:30 PM and usual wake time is around 6 or 6:30 AM. Sleep onset typically occurs within a few minutes. He reports feeling fairly well rested upon awakening. He wakes up on an average 1 in Brian middle of Brian night and has to go to Brian bathroom 1 time on a typical night. He denies morning headaches.  He reports excessive daytime somnolence (EDS) and His Epworth Sleepiness Score (ESS) is 13/24 today. He has not fallen asleep while driving. Brian Brian [name] has been taking an unplanned nap now and then, which is usually 30 minutes long. He denies dreaming in a nap and reports feeling not much refreshed after a nap.  He has been known to snore for Brian past many years. Snoring is reportedly mild, and not associated with choking sounds and witnessed apneas. Brian Brian [name] denies  a sense of choking or strangling feeling. There is no report of nighttime reflux, with no nighttime cough experienced. Brian Brian [name] has not noted any RLS symptoms and is not known to kick while asleep or before falling asleep. He is a restless sleeper and in Brian morning, Brian bed is quite disheveled.   He denies cataplexy, sleep paralysis, hypnagogic or hypnopompic hallucinations, or sleep attacks. He does not report any vivid dreams, nightmares, dream enactments, or parasomnias, such as sleep talking or sleep walking. Brian Brian [name] has not had a sleep study or a home sleep test.  He consumes multiple caffeinated beverages per day, usually in Brian form of diet soda, tea and coffee.   His Past Medical History Is Significant For: Past Medical History  Diagnosis Date  . Type II or unspecified type diabetes mellitus with renal manifestations, not stated as uncontrolled(250.40)   . Unspecified essential hypertension   . Other and unspecified hyperlipidemia   . Acute venous embolism and thrombosis of unspecified deep vessels of lower extremity   . Long term (current) use of anticoagulants   . Chronic kidney disease, stage III (moderate)   . Obstructive sleep apnea (adult) (pediatric)   . Neuralgia, neuritis, and radiculitis, unspecified   . Obesity, unspecified   . Ankle fracture     right  . Leg fracture, right   . Glaucoma     His Past Surgical History Is Significant For: Past Surgical History  Procedure Laterality Date  . Appendectomy  1969  . Partial hip arthroplasty Right 2008  . Cataract extraction Bilateral 2013  His Family History Is Significant For: Family History  Problem Relation Age of Onset  . Heart failure Brother   . Heart failure Father   . Heart failure Mother   . Stroke Mother   . Cancer Brother   . Cancer Sister   . Hypertension Daughter     His Social History Is Significant For: History   Social History  . Marital Status: Married    Spouse Name: N/A     Number of Children: N/A  . Years of Education: N/A   Social History Main Topics  . Smoking status: Former Smoker    Types: Pipe, Cigars    Quit date: 11/12/1962  . Smokeless tobacco: None  . Alcohol Use: No  . Drug Use: No  . Sexually Active: None   Other Topics Concern  . None   Social History Narrative  . None    His Allergies Are:  Allergies  Allergen Reactions  . Penicillins   :   His Current Medications Are:  Outpatient Encounter Prescriptions as of 11/11/2012  Medication Sig Dispense Refill  . allopurinol (ZYLOPRIM) 100 MG tablet       . amLODipine (NORVASC) 5 MG tablet       . latanoprost (XALATAN) 0.005 % ophthalmic solution       . lisinopril (PRINIVIL,ZESTRIL) 40 MG tablet       . simvastatin (ZOCOR) 20 MG tablet       . warfarin (COUMADIN) 5 MG tablet        No facility-administered encounter medications on file as of 11/11/2012.  : Review of Systems  HENT: Positive for hearing loss.   Eyes: Positive for visual disturbance.  Psychiatric/Behavioral:       Too much sleep    Objective:  Neurologic Exam  Physical Exam Physical Examination:   Filed Vitals:   11/11/12 1001  BP: 111/59  Pulse: 66  Temp: 97.5 F (36.4 C)    General Examination: Brian Brian [name] is a very pleasant 79 y.o. male in no acute distress. He appears well-developed and well-nourished and well groomed.   HEENT: Normocephalic, atraumatic, pupils are equal, round and reactive to light and accommodation. He has a l scleral hemorrhage. Extraocular tracking is good without limitation to gaze excursion or nystagmus noted. Normal smooth pursuit is noted. Hearing is impaired wth hearing aids in place bilaterally. Face is symmetric with normal facial animation and normal facial sensation. Speech is clear with no dysarthria noted. There is no hypophonia. There is no lip, neck/head, jaw or voice tremor. Neck is supple with full range of passive and active motion. There are no carotid bruits on  auscultation. Oropharynx exam reveals: mild mouth dryness, adequate dental hygiene and moderate airway crowding, due to floppy soft palate. Mallampati is class II. Tongue protrudes centrally and palate elevates symmetrically. Tonsils are 1+. Neck size is 19.75 inches.   Chest: Clear to auscultation without wheezing, rhonchi or crackles noted.  Heart: S1+S2+0, regular and normal without murmurs, rubs or gallops noted.   Abdomen: Soft, non-tender and non-distended with normal bowel sounds appreciated on auscultation.  Extremities: There is trace pitting edema in Brian distal lower extremities bilaterally around Brian ankles. Pedal pulses are intact.  Skin: Warm and dry without trophic changes noted. There are no varicose veins.  Musculoskeletal: exam reveals no obvious joint deformities, tenderness or joint swelling or erythema.   Neurologically:  Mental status: Brian Brian [name] is awake, alert and oriented in all 4 spheres. His memory, attention, language and knowledge  are appropriate. There is no aphasia, agnosia, apraxia or anomia. Speech is clear with normal prosody and enunciation. Thought process is linear. Mood is congruent and affect is normal.  Cranial nerves are as described above under HEENT exam. In addition, shoulder shrug is normal with equal shoulder height noted. Motor exam: Normal bulk, strength and tone is noted. There is no drift, tremor or rebound. Romberg is negative. Reflexes are 2+ throughout. Toes are downgoing bilaterally. Fine motor skills are intact with normal finger taps, normal hand movements, normal rapid alternating patting, normal foot taps and normal foot agility.  Cerebellar testing shows no dysmetria or intention tremor on finger to nose testing. Heel to shin is unremarkable bilaterally. There is no truncal or gait ataxia.  Sensory exam is intact to light touch, pinprick, vibration, temperature sense and proprioception in Brian upper and lower extremities.  Gait, station  and balance: he stands up slowly and has to push himself up and he is hurting in his R hip and walks slowly with a limp. No veering to one side is noted. No leaning to one side is noted. Posture is age-appropriate and stance is slightly wide based.   Assessment and Plan:   In summary, Brian Brian [name] is a very pleasant 79 y.o.-year old male with a history and physical exam concerning for obstructive sleep apnea (OSA). I had a long chat with Brian Brian [name] and his wife about my findings and Brian diagnosis, its prognosis and treatment options. We talked about medical treatments and non-pharmacological approaches. I explained in particular Brian risks and ramifications of untreated moderate to severe OSA, especially with respect to developing cardiovascular disease down Brian Road, including congestive heart failure, difficult to treat hypertension, cardiac arrhythmias, or stroke. Even type 2 diabetes has in part been linked to untreated OSA. We talked about trying to maintain a healthy lifestyle in general, as well as Brian importance of weight control. I encouraged Brian Brian [name] to eat healthy, exercise daily and keep well hydrated, to keep a scheduled bedtime and wake time routine, to not skip any meals and eat healthy snacks in between meals. I would like for him to reduce his caffeine intake, if possible. I recommended Brian following at this time: sleep study with potential CPAP titration.  I explained Brian sleep test procedure to Brian Brian [name] and also outlined surgical and non-surgical treatment options of OSA including Brian use of a dental custom-made appliance, upper airway surgery such as pillar implants, radiofrequency surgery, tongue base surgery, and UPPP. I also explained Brian CPAP treatment option to Brian Brian [name], who indicated that he would be willing to try CPAP if Brian need arises. I explained Brian importance of being compliant with PAP treatment, not only for insurance purposes but primarily for Brian Brian [name]'s long  term health benefit. I answered all their questions today and Brian Brian [name] and his wife were in agreement. I would like to see him back after Brian sleep study is completed and encouraged them to call with any interim questions, concerns, problems or updates.   Thank you very much for allowing me to participate in Brian care of this nice Brian [name]. If I can be of any further assistance to you please do not hesitate to call me at [phone].  Sincerely,   Brian Foley, MD, PhD

## 2012-11-19 ENCOUNTER — Ambulatory Visit (INDEPENDENT_AMBULATORY_CARE_PROVIDER_SITE_OTHER): Payer: Medicare Other | Admitting: Neurology

## 2012-11-19 DIAGNOSIS — G4761 Periodic limb movement disorder: Secondary | ICD-10-CM

## 2012-11-19 DIAGNOSIS — G4733 Obstructive sleep apnea (adult) (pediatric): Secondary | ICD-10-CM

## 2012-11-19 DIAGNOSIS — E669 Obesity, unspecified: Secondary | ICD-10-CM

## 2012-11-19 DIAGNOSIS — R0683 Snoring: Secondary | ICD-10-CM

## 2012-11-19 DIAGNOSIS — R400 Somnolence: Secondary | ICD-10-CM

## 2012-12-06 ENCOUNTER — Telehealth: Payer: Self-pay | Admitting: Neurology

## 2012-12-06 DIAGNOSIS — G4733 Obstructive sleep apnea (adult) (pediatric): Secondary | ICD-10-CM

## 2012-12-06 NOTE — Telephone Encounter (Signed)
Please call and notify patient that the recent sleep study confirmed the diagnosis of OSA. He did well with CPAP during the study with significant improvement of the respiratory events. Therefore, I would like start the patient on CPAP at home. I placed the order in the chart.   Arrange for CPAP set up at home through a DME company of patient's choice and fax/route report to PCP and referring MD (if other than PCP).   The patient will also need a follow up appointment with me in 6-8 weeks post set up that has to be scheduled; help the patient schedule this (in a follow-up slot).   Please re-enforce the importance of compliance with treatment and the need for us to monitor compliance data.   Once you have spoken to the patient and scheduled the return appointment, you may close this encounter, thanks,   Jelene Albano, MD, PhD [facility]    

## 2012-12-12 ENCOUNTER — Encounter (INDEPENDENT_AMBULATORY_CARE_PROVIDER_SITE_OTHER): Payer: Self-pay | Admitting: Ophthalmology

## 2012-12-12 ENCOUNTER — Encounter: Payer: Self-pay | Admitting: *Deleted

## 2012-12-12 DIAGNOSIS — H3530 Unspecified macular degeneration: Secondary | ICD-10-CM

## 2012-12-12 DIAGNOSIS — H353290 Exudative age-related macular degeneration, unspecified eye, stage unspecified: Secondary | ICD-10-CM

## 2012-12-12 DIAGNOSIS — H43819 Vitreous degeneration, unspecified eye: Secondary | ICD-10-CM

## 2012-12-12 DIAGNOSIS — H35039 Hypertensive retinopathy, unspecified eye: Secondary | ICD-10-CM

## 2012-12-12 DIAGNOSIS — I10 Essential (primary) hypertension: Secondary | ICD-10-CM

## 2012-12-12 NOTE — Telephone Encounter (Signed)
Called patient to discuss sleep study results.  Spoke to spouse who asked to take a message.  I explain I was just calling with results and would send a copy out in the mail with a letter regarding follow up.  I briefly explained that Dr. Frances Furbish wanted patient to start using CPAP and an order would be sent to [facility] in order to be setup with equipment.  A copy of the sleep study interpretive report as well as a letter with info regarding contact info for the DME company, the importance of CPAP compliance, and the date of the follow up appointment info will be mailed to the patient's home.  Results will be forwarded to Dr. Clelia Croft

## 2013-01-09 ENCOUNTER — Encounter (INDEPENDENT_AMBULATORY_CARE_PROVIDER_SITE_OTHER): Payer: Medicare Other | Admitting: Ophthalmology

## 2013-01-09 DIAGNOSIS — H3530 Unspecified macular degeneration: Secondary | ICD-10-CM

## 2013-01-09 DIAGNOSIS — I10 Essential (primary) hypertension: Secondary | ICD-10-CM

## 2013-01-09 DIAGNOSIS — H43819 Vitreous degeneration, unspecified eye: Secondary | ICD-10-CM

## 2013-01-09 DIAGNOSIS — H35039 Hypertensive retinopathy, unspecified eye: Secondary | ICD-10-CM

## 2013-01-09 DIAGNOSIS — H353290 Exudative age-related macular degeneration, unspecified eye, stage unspecified: Secondary | ICD-10-CM

## 2013-02-06 ENCOUNTER — Encounter (INDEPENDENT_AMBULATORY_CARE_PROVIDER_SITE_OTHER): Payer: Medicare Other | Admitting: Ophthalmology

## 2013-02-06 ENCOUNTER — Other Ambulatory Visit: Payer: Self-pay | Admitting: Orthopedic Surgery

## 2013-02-06 DIAGNOSIS — H3530 Unspecified macular degeneration: Secondary | ICD-10-CM

## 2013-02-06 DIAGNOSIS — H43819 Vitreous degeneration, unspecified eye: Secondary | ICD-10-CM

## 2013-02-06 DIAGNOSIS — H353290 Exudative age-related macular degeneration, unspecified eye, stage unspecified: Secondary | ICD-10-CM

## 2013-02-06 DIAGNOSIS — H35039 Hypertensive retinopathy, unspecified eye: Secondary | ICD-10-CM

## 2013-02-06 DIAGNOSIS — I10 Essential (primary) hypertension: Secondary | ICD-10-CM

## 2013-02-07 ENCOUNTER — Encounter: Payer: Self-pay | Admitting: Neurology

## 2013-02-07 NOTE — Progress Notes (Signed)
Quick Note:  I reviewed the patient's CPAP compliance data from 12/27/2012 to 01/25/2013, which is a total of 30 days, during which time the patient used CPAP every day except for 3 days. The average usage for all days was 4 hours and 21 minutes. The percent used days greater than 4 hours was 60 %, indicating good to fair compliance. The residual AHI was 3.2 per hour, indicating an adequate treatment pressure of 7 cwp with EPR of 1. I will review this data with the patient at the next visit next week, provide feedback and additional trouble shooting if need be.  [name], MD, PhD [facility]   ______

## 2013-02-10 ENCOUNTER — Ambulatory Visit (INDEPENDENT_AMBULATORY_CARE_PROVIDER_SITE_OTHER): Payer: Medicare Other | Admitting: Neurology

## 2013-02-10 ENCOUNTER — Encounter: Payer: Self-pay | Admitting: Neurology

## 2013-02-10 VITALS — BP 109/59 | HR 75 | Temp 98.1°F | Ht 67.0 in | Wt 217.0 lb

## 2013-02-10 DIAGNOSIS — M25551 Pain in right hip: Secondary | ICD-10-CM

## 2013-02-10 DIAGNOSIS — G4733 Obstructive sleep apnea (adult) (pediatric): Secondary | ICD-10-CM

## 2013-02-10 DIAGNOSIS — R400 Somnolence: Secondary | ICD-10-CM

## 2013-02-10 DIAGNOSIS — E669 Obesity, unspecified: Secondary | ICD-10-CM

## 2013-02-10 DIAGNOSIS — M25559 Pain in unspecified hip: Secondary | ICD-10-CM

## 2013-02-10 DIAGNOSIS — G4710 Hypersomnia, unspecified: Secondary | ICD-10-CM

## 2013-02-10 NOTE — Progress Notes (Signed)
Subjective:    Patient ID: Brian [name]. is a 79 y.o. male.  HPI  Interim history:   [name] is a very pleasant 79 year old right-handed gentleman with an underlying medical history of diabetes, hypertension, hyperlipidemia, gout, macular degeneration, DVT twice with lifelong anticoagulation, glaucoma, and osteoarthritis who is scheduled orthopedic surgery for his R hip problems later this month and presents for followup consultation of his obstructive sleep apnea. He is accompanied by his wife again today. I first met him on 11/11/2012 and suggested a sleep study. He had a split-night sleep study on 11/19/2012 and I went over his test results with him in detail today. His baseline sleep efficiency was reduced at 82.8% with a latency to sleep of 6.5 minutes and wake after sleep onset of 21.5 minutes with moderate sleep fragmentation noted. He had a increased percentage of stage I and stage II sleep, near absence of slow-wave sleep and a reduced percentage of REM sleep. He had a normal REM latency. He had a baseline AHI of 18.7 per hour, rising to 67.5 per hour and REM sleep. His baseline oxygen saturation was 95%, his nadir was 76% and REM sleep. He was titrated on CPAP from 5-7 cm with a reduction of his AHI to 0 events per hour at a pressure of 7. Supine REM sleep was achieved. I also reviewed his initial compliance data from 12/27/2012 through 01/25/2013 which is a total of 30 days during which time he used it every day except for 3 days. Percent used days greater than 4 hours was 60%. His average usage for all days was 4 hours and 21 minutes. His residual AHI was 3.2 per hour on a CPAP pressure of 7 cm with EPR of 1. I also reviewed his most recent compliance download from his machine from 12/25/2012 through 02/10/2013 which is a total of 48 days during which time he used it every day except 6 days. His percent used days greater than 4 hours was only 56%. Average usage for all days was 4 hours  and 11 minutes. His residual AHI was 4 per hour indicating still a fairly appropriate pressure of 7 cm with EPR of 1. He had soreness in his nose from the nasal pillows and had to change to a mask, which is FFM. He is still trying to adjust to it and trying to tolerate the mask. His still is sleepy in the day, but also, his sleep is disrupted by pain in his R hip and his R knee. He has a little pain in the L hip. He sleeps on his back.    His Past Medical History Is Significant For: Past Medical History  Diagnosis Date  . Type II or unspecified type diabetes mellitus with renal manifestations, not stated as uncontrolled(250.40)   . Unspecified essential hypertension   . Other and unspecified hyperlipidemia   . Acute venous embolism and thrombosis of unspecified deep vessels of lower extremity   . Long term (current) use of anticoagulants   . Chronic kidney disease, stage III (moderate)   . Obstructive sleep apnea (adult) (pediatric)   . Neuralgia, neuritis, and radiculitis, unspecified   . Obesity, unspecified   . Ankle fracture     right  . Leg fracture, right   . Glaucoma     His Past Surgical History Is Significant For: Past Surgical History  Procedure Laterality Date  . Appendectomy  1969  . Partial hip arthroplasty Right 2008  . Cataract extraction Bilateral  2013    His Family History Is Significant For: Family History  Problem Relation Age of Onset  . Heart failure Brother   . Heart failure Father   . Heart failure Mother   . Stroke Mother   . Cancer Brother   . Cancer Sister   . Hypertension Daughter     His Social History Is Significant For: History   Social History  . Marital Status: Married    Spouse Name: N/A    Number of Children: N/A  . Years of Education: N/A   Social History Main Topics  . Smoking status: Former Smoker    Types: Pipe, Cigars    Quit date: 11/12/1962  . Smokeless tobacco: None  . Alcohol Use: No  . Drug Use: No  . Sexual  Activity: None   Other Topics Concern  . None   Social History Narrative  . None    His Allergies Are:  Allergies  Allergen Reactions  . Penicillins   :   His Current Medications Are:  Outpatient Encounter Prescriptions as of 02/10/2013  Medication Sig Dispense Refill  . allopurinol (ZYLOPRIM) 100 MG tablet       . amLODipine (NORVASC) 5 MG tablet       . BESIVANCE 0.6 % SUSP       . enoxaparin (LOVENOX) 100 MG/ML injection       . fenofibrate 160 MG tablet Take 160 mg by mouth daily.      Marland Kitchen latanoprost (XALATAN) 0.005 % ophthalmic solution       . linagliptin (TRADJENTA) 5 MG TABS tablet Take 5 mg by mouth daily.      Marland Kitchen lisinopril (PRINIVIL,ZESTRIL) 40 MG tablet       . loratadine-pseudoephedrine (CLARITIN-D 24-HOUR) 10-240 MG per 24 hr tablet Take 1 tablet by mouth daily.      . niacin (NIASPAN) 1000 MG CR tablet       . simvastatin (ZOCOR) 20 MG tablet       . warfarin (COUMADIN) 5 MG tablet        No facility-administered encounter medications on file as of 02/10/2013.   Review of Systems  HENT: Positive for hearing loss.   Eyes: Positive for visual disturbance (macular degeneration).  Hematological: Bruises/bleeds easily.    Objective:  Neurologic Exam  Physical Exam Physical Examination:   Filed Vitals:   02/10/13 1146  BP: 109/59  Pulse: 75  Temp: 98.1 F (36.7 C)    General Examination: The patient is a very pleasant 79 y.o. male in no acute distress. He appears well-developed and well-nourished and well groomed.   HEENT: Normocephalic, atraumatic, pupils are equal, round and reactive to light and accommodation. Extraocular tracking is good without limitation to gaze excursion or nystagmus noted. Normal smooth pursuit is noted. Hearing is impaired wth hearing aids in place bilaterally. Face is symmetric with normal facial animation and normal facial sensation. Speech is clear with no dysarthria noted. There is no hypophonia. There is no lip, neck/head,  jaw or voice tremor. Neck is supple with full range of passive and active motion. There are no carotid bruits on auscultation. Oropharynx exam reveals: mild mouth dryness, adequate dental hygiene and moderate airway crowding, due to floppy soft palate. Mallampati is class II. Tongue protrudes centrally and palate elevates symmetrically. Tonsils are 1+.   Chest: Clear to auscultation without wheezing, rhonchi or crackles noted.  Heart: S1+S2+0, regular and normal without murmurs, rubs or gallops noted.   Abdomen: Soft, non-tender and  non-distended with normal bowel sounds appreciated on auscultation.  Extremities: There is trace pitting edema in the distal lower extremities bilaterally around the ankles. Pedal pulses are intact.  Skin: Warm and dry without trophic changes noted. There are no varicose veins.  Musculoskeletal: exam reveals no obvious joint deformities, tenderness or joint swelling or erythema.   Neurologically:  Mental status: The patient is awake, alert and oriented in all 4 spheres. His memory, attention, language and knowledge are appropriate. There is no aphasia, agnosia, apraxia or anomia. Speech is clear with normal prosody and enunciation. Thought process is linear. Mood is congruent and affect is normal.  Cranial nerves are as described above under HEENT exam. In addition, shoulder shrug is normal with equal shoulder height noted. Motor exam: Normal bulk, strength and tone is noted. There is no drift, tremor or rebound. Romberg is negative. Reflexes are 2+ throughout. Toes are downgoing bilaterally. Fine motor skills are intact with normal finger taps, normal hand movements, normal rapid alternating patting, normal foot taps and normal foot agility.  Cerebellar testing shows no dysmetria or intention tremor on finger to nose testing. There is no truncal or gait ataxia.  Sensory exam is intact to light touch.  Gait, station and balance: he stands up slowly and has to push  himself up and he is hurting in his R hip and walks slowly with a limp. No veering to one side is noted. No leaning to one side is noted. Posture is age-appropriate and stance is slightly wide based.   Assessment and Plan:    In summary, Cordera Stineman. is a very pleasant 79 y.o.-year old male with a history of OSA, on CPAP treatment at a pressure of 7 cwp. His physical exam is stable and He indicates fair results with the use of CPAP, and adequate tolerance of the pressure and mask. I reviewed the compliance data with the patient and encouraged him to continue to use CPAP regularly to help reduce cardiovascular risk. He still has residual right hip and right knee pain which bothers him at night. He is scheduled for right hip surgery later this month and I'm hoping that once his hip pain is under control he will sleep better. I talked to him about his sleep study report and his compliance download at length today. I explained the findings.   We also talked about trying to maintaining a healthy lifestyle in general. I encouraged the patient to eat healthy, exercise daily and keep well hydrated, to keep a scheduled bedtime and wake time routine, to not skip any meals and eat healthy snacks in between meals and to have protein with every meal. I stressed the importance of regular exercise.   I answered all their questions today and the patient and his wife were in agreement with the above outlined plan. I would like to see the patient back in 3 months, sooner if the need arises and encouraged them to call with any interim questions, concerns, problems or updates.

## 2013-02-10 NOTE — Patient Instructions (Signed)
Please continue using your CPAP regularly. While your insurance requires that you use CPAP at least 4 hours each night on 70% of the nights, I recommend, that you not skip any nights and use it throughout the night if you can. Getting used to CPAP does take time and patience and discipline. Untreated obstructive sleep apnea when it is moderate to severe can have an adverse impact on cardiovascular health and raise her risk for heart disease, arrhythmias, hypertension, congestive heart failure, stroke and diabetes. Untreated obstructive sleep apnea causes sleep disruption, nonrestorative sleep, and sleep deprivation. This can have an impact on your day to day functioning and cause daytime sleepiness and impairment of cognitive function, memory loss, mood disturbance, and problems focussing. Using CPAP regularly can improve these symptoms.   

## 2013-02-13 ENCOUNTER — Encounter (HOSPITAL_COMMUNITY): Payer: Self-pay | Admitting: Pharmacy Technician

## 2013-02-17 ENCOUNTER — Encounter (HOSPITAL_COMMUNITY)
Admission: RE | Admit: 2013-02-17 | Discharge: 2013-02-17 | Disposition: A | Discharge status: Home / Self Care | Payer: Medicare Other | Source: Ambulatory Visit | Attending: Orthopedic Surgery | Admitting: Orthopedic Surgery

## 2013-02-17 ENCOUNTER — Encounter (HOSPITAL_COMMUNITY): Payer: Self-pay

## 2013-02-17 ENCOUNTER — Ambulatory Visit (HOSPITAL_COMMUNITY)
Admission: RE | Admit: 2013-02-17 | Discharge: 2013-02-17 | Disposition: A | Discharge status: Home / Self Care | Payer: Medicare Other | Source: Ambulatory Visit | Attending: Orthopedic Surgery | Admitting: Orthopedic Surgery

## 2013-02-17 DIAGNOSIS — J986 Disorders of diaphragm: Secondary | ICD-10-CM | POA: Insufficient documentation

## 2013-02-17 DIAGNOSIS — X58XXXA Exposure to other specified factors, initial encounter: Secondary | ICD-10-CM | POA: Insufficient documentation

## 2013-02-17 DIAGNOSIS — Z01810 Encounter for preprocedural cardiovascular examination: Secondary | ICD-10-CM | POA: Insufficient documentation

## 2013-02-17 DIAGNOSIS — Z01812 Encounter for preprocedural laboratory examination: Secondary | ICD-10-CM | POA: Insufficient documentation

## 2013-02-17 DIAGNOSIS — Z01818 Encounter for other preprocedural examination: Secondary | ICD-10-CM | POA: Insufficient documentation

## 2013-02-17 DIAGNOSIS — IMO0002 Reserved for concepts with insufficient information to code with codable children: Secondary | ICD-10-CM | POA: Insufficient documentation

## 2013-02-17 HISTORY — DX: Unspecified osteoarthritis, unspecified site: M19.90

## 2013-02-17 HISTORY — DX: Unspecified macular degeneration: H35.30

## 2013-02-17 HISTORY — DX: Unspecified abdominal hernia without obstruction or gangrene: K46.9

## 2013-02-17 HISTORY — DX: Nausea with vomiting, unspecified: R11.2

## 2013-02-17 HISTORY — DX: Other specified postprocedural states: Z98.890

## 2013-02-17 LAB — BASIC METABOLIC PANEL
CO2: 28 mEq/L (ref 19–32)
Calcium: 10 mg/dL (ref 8.4–10.5)
Creatinine, Ser: 1.58 mg/dL — ABNORMAL HIGH (ref 0.50–1.35)
Glucose, Bld: 136 mg/dL — ABNORMAL HIGH (ref 70–99)

## 2013-02-17 LAB — PROTIME-INR: Prothrombin Time: 22.9 seconds — ABNORMAL HIGH (ref 11.6–15.2)

## 2013-02-17 LAB — CBC
MCH: 30.4 pg (ref 26.0–34.0)
MCV: 92.5 fL (ref 78.0–100.0)
Platelets: 185 10*3/uL (ref 150–400)
RBC: 4.27 MIL/uL (ref 4.22–5.81)

## 2013-02-17 NOTE — Pre-Procedure Instructions (Signed)
EKG AND CXR WERE DONE TODAY - PREOP  AT [facility]. AS PER ANESTHESIOLOGIST'S GUIDELINES. PT'S ABNORMAL PT, INR, PTT FAXED TO DR. ALUISIO'S OFFICE.  ORDER IN EPIC TO REPEAT PT, INR DAY OF SURGERY AS PER ANESTHESIOLOGIST'S GUIDELINES.

## 2013-02-17 NOTE — Patient Instructions (Addendum)
YOUR SURGERY IS SCHEDULED AT [facility]  ON:  Friday  10/24  REPORT TO [place] [facility] AT:   12:15PM      PHONE # FOR SHORT STAY IS [phone]  DO NOT EAT ANYTHING AFTER MIDNIGHT THE NIGHT BEFORE YOUR SURGERY.  NO FOOD, NO CHEWING GUM, NO MINTS, NO CANDIES, NO CHEWING TOBACCO. YOU MAY HAVE CLEAR LIQUIDS TO DRINK FROM MIDNIGHT UNTIL 8:45 AM DAY OF SURGERY - LIKE WATER,  SODA.   NOTHING TO DRINK AFTER 8:45 AM DAY OF YOUR SURGERY.  PLEASE TAKE THE FOLLOWING MEDICATIONS THE AM OF YOUR SURGERY WITH A FEW SIPS OF WATER:  ALLOPURINOL, AMLODIPINE.  USE YOUR BRIMONIDINE EYE DROPS. BRING BOTH OF YOUR EYE DROPS TO HOSPITAL.    IF YOU ARE DIABETIC:  DO NOT TAKE ANY DIABETIC MEDICATIONS THE AM OF YOUR SURGERY.    IF YOU HAVE SLEEP APNEA AND USE CPAP OR BIPAP--PLEASE BRING THE MASK AND THE TUBING.  DO NOT BRING YOUR MACHINE.  DO NOT BRING VALUABLES, MONEY, CREDIT CARDS.  DO NOT WEAR JEWELRY, MAKE-UP, NAIL POLISH AND NO METAL PINS OR CLIPS IN YOUR HAIR. CONTACT LENS, DENTURES / PARTIALS, GLASSES SHOULD NOT BE WORN TO SURGERY AND IN MOST CASES-HEARING AIDS WILL NEED TO BE REMOVED.  BRING YOUR GLASSES CASE, ANY EQUIPMENT NEEDED FOR YOUR CONTACT LENS. FOR PATIENTS ADMITTED TO THE HOSPITAL--CHECK OUT TIME THE DAY OF DISCHARGE IS 11:00 AM.  ALL INPATIENT ROOMS ARE PRIVATE - WITH BATHROOM, TELEPHONE, TELEVISION AND WIFI INTERNET.                                PLEASE READ OVER ANY  FACT SHEETS THAT YOU WERE GIVEN: INCENTIVE SPIROMETER INFORMATION. FAILURE TO FOLLOW THESE INSTRUCTIONS MAY RESULT IN THE CANCELLATION OF YOUR SURGERY.   PATIENT SIGNATURE_________________________________

## 2013-02-20 NOTE — H&P (Signed)
CC- Brian [name]. is a 79 y.o. male who presents with right hip pain  Hip Pain: Patient complains of right hip pain. Onset of the symptoms was several years ago. Inciting event: He has had persistent pain laterally since hip replacement several years ago. Current symptoms include is aggravated by walking. Associated symptoms: none. Aggravating symptoms: pivoting, rising after sitting and walking.Patient has had prior hip problems. Evaluation to date: plain films, which were normal post-op hip replacement and MRI with possible gluteal tendon tear.  Treatment to date: physical therapy, which has been not very effective and injections with minimal benefit.  Past Medical History  Diagnosis Date  . Type II or unspecified type diabetes mellitus with renal manifestations, not stated as uncontrolled(250.40)   . Unspecified essential hypertension   . Other and unspecified hyperlipidemia   . Long term (current) use of anticoagulants   . Chronic kidney disease, stage III (moderate)   . Neuralgia, neuritis, and radiculitis, unspecified   . Obesity, unspecified   . Ankle fracture     right  . Leg fracture, right   . Glaucoma   . Obstructive sleep apnea (adult) (pediatric)     USING CPAP SETTING 7  . Acute venous embolism and thrombosis of unspecified deep vessels of lower extremity     15 OR 20 YRS AGO - RT LOWER LEG TWO DIFFERENT INCIDENTS OF CLOTS - CHRONIC COUMADIN SINCE  . Hernia     PT STATES HERNIA WHERE INCISION WAS FOR APPENDECTOMY AND INCISION MID ABDOMEN  . Arthritis     SPINE - STATES PAIN DOWN RT LEG  . Macular degeneration of both eyes     LEGALLY BLIND BOTH EYES -GETS MONTHLY INJECTIONS BOTH EYES- DR. MATTHEWS  . PONV (postoperative nausea and vomiting)     Past Surgical History  Procedure Laterality Date  . Appendectomy  1969  . Partial hip arthroplasty Right 2008  . Cataract extraction Bilateral 2013  . Eye surgery      Prior to Admission medications   Medication Sig  Start Date End Date Taking? Authorizing Provider  allopurinol (ZYLOPRIM) 100 MG tablet Take 100 mg by mouth daily.  10/17/12   Historical Provider, MD  amLODipine (NORVASC) 5 MG tablet Take 5 mg by mouth every morning.  10/17/12   Historical Provider, MD  brimonidine (ALPHAGAN) 0.15 % ophthalmic solution Place 1 drop into both eyes 2 (two) times daily.    Historical Provider, MD  Calcium Carbonate-Vitamin D (CALCIUM + D PO) Take by mouth. DAILY    Historical Provider, MD  enoxaparin (LOVENOX) 100 MG/ML injection Inject 100 mg into the skin every 12 (twelve) hours.  01/03/13   Historical Provider, MD  Fenofibrate (LIPOFEN) 150 MG CAPS Take 1 capsule by mouth daily.    Historical Provider, MD  fenofibrate 160 MG tablet Take 160 mg by mouth daily.    Historical Provider, MD  latanoprost (XALATAN) 0.005 % ophthalmic solution Place 1 drop into both eyes at bedtime.  09/10/12   Historical Provider, MD  linagliptin (TRADJENTA) 5 MG TABS tablet Take 5 mg by mouth daily.    Historical Provider, MD  lisinopril (PRINIVIL,ZESTRIL) 40 MG tablet Take 40 mg by mouth every morning.  10/17/12   Historical Provider, MD  loratadine-pseudoephedrine (CLARITIN-D 24-HOUR) 10-240 MG per 24 hr tablet Take 1 tablet by mouth daily.    Historical Provider, MD  Multiple Vitamins-Minerals (PRESERVISION AREDS PO) Take 1 tablet by mouth 2 (two) times daily.    Historical Provider,  MD  niacin (NIASPAN) 1000 MG CR tablet Take 1,000 mg by mouth at bedtime.    Historical Provider, MD  simvastatin (ZOCOR) 20 MG tablet Take 20 mg by mouth at bedtime.  10/17/12   Historical Provider, MD  warfarin (COUMADIN) 5 MG tablet Take 2.5-5 mg by mouth daily. Takes half tablet every day except Monday Wednesday and Friday he takes a whole tablet 10/17/12   Historical Provider, MD    Physical Examination: General appearance - alert, well appearing, and in no distress Chest - clear to auscultation, no wheezes, rales or rhonchi, symmetric air entry Heart -  normal rate, regular rhythm, normal S1, S2, no murmurs, rubs, clicks or gallops Abdomen - soft, nontender, nondistended, no masses or organomegaly Neurological - alert, oriented, normal speech, no focal findings or movement disorder noted  A right hip exam was performed. SWELLING: none WARMTH: no warmth TENDERNESS: maximal at greater trochanter ROM: normal STRENGTH: normal GAIT: antalgic  ASSESSMENT: Right lateral hip pain with probable gluteal tendon tear  Plan Right hip bursectomy and gluteal tendon repair. Discussed procedure, risks and potential complications with patient and he elects to proceed.  Gus Rankin Siarra Gilkerson, MD    02/20/2013, 9:27 PM

## 2013-02-21 ENCOUNTER — Observation Stay (HOSPITAL_COMMUNITY)
Admission: RE | Admit: 2013-02-21 | Discharge: 2013-02-22 | Disposition: A | Discharge status: Home / Self Care | Payer: Medicare Other | Source: Ambulatory Visit | Attending: Orthopedic Surgery | Admitting: Orthopedic Surgery

## 2013-02-21 ENCOUNTER — Encounter (HOSPITAL_COMMUNITY): Payer: Self-pay | Admitting: *Deleted

## 2013-02-21 ENCOUNTER — Encounter (HOSPITAL_COMMUNITY): Payer: Medicare Other | Admitting: Anesthesiology

## 2013-02-21 ENCOUNTER — Encounter (HOSPITAL_COMMUNITY)
Admission: RE | Disposition: A | Discharge status: Home / Self Care | Payer: Self-pay | Source: Ambulatory Visit | Attending: Orthopedic Surgery

## 2013-02-21 ENCOUNTER — Ambulatory Visit (HOSPITAL_COMMUNITY): Payer: Medicare Other | Admitting: Anesthesiology

## 2013-02-21 DIAGNOSIS — H409 Unspecified glaucoma: Secondary | ICD-10-CM | POA: Insufficient documentation

## 2013-02-21 DIAGNOSIS — G4733 Obstructive sleep apnea (adult) (pediatric): Secondary | ICD-10-CM | POA: Insufficient documentation

## 2013-02-21 DIAGNOSIS — M6688 Spontaneous rupture of other tendons, other: Secondary | ICD-10-CM | POA: Insufficient documentation

## 2013-02-21 DIAGNOSIS — H3530 Unspecified macular degeneration: Secondary | ICD-10-CM | POA: Insufficient documentation

## 2013-02-21 DIAGNOSIS — M25551 Pain in right hip: Secondary | ICD-10-CM

## 2013-02-21 DIAGNOSIS — Z96649 Presence of unspecified artificial hip joint: Secondary | ICD-10-CM | POA: Insufficient documentation

## 2013-02-21 DIAGNOSIS — Z79899 Other long term (current) drug therapy: Secondary | ICD-10-CM | POA: Insufficient documentation

## 2013-02-21 DIAGNOSIS — E669 Obesity, unspecified: Secondary | ICD-10-CM | POA: Insufficient documentation

## 2013-02-21 DIAGNOSIS — Z7901 Long term (current) use of anticoagulants: Secondary | ICD-10-CM | POA: Insufficient documentation

## 2013-02-21 DIAGNOSIS — N183 Chronic kidney disease, stage 3 unspecified: Secondary | ICD-10-CM | POA: Insufficient documentation

## 2013-02-21 DIAGNOSIS — E785 Hyperlipidemia, unspecified: Secondary | ICD-10-CM | POA: Insufficient documentation

## 2013-02-21 DIAGNOSIS — E1129 Type 2 diabetes mellitus with other diabetic kidney complication: Secondary | ICD-10-CM | POA: Insufficient documentation

## 2013-02-21 DIAGNOSIS — M76899 Other specified enthesopathies of unspecified lower limb, excluding foot: Principal | ICD-10-CM | POA: Insufficient documentation

## 2013-02-21 DIAGNOSIS — I129 Hypertensive chronic kidney disease with stage 1 through stage 4 chronic kidney disease, or unspecified chronic kidney disease: Secondary | ICD-10-CM | POA: Insufficient documentation

## 2013-02-21 HISTORY — PX: EXCISION/RELEASE BURSA HIP: SHX5014

## 2013-02-21 LAB — GLUCOSE, CAPILLARY
Glucose-Capillary: 134 mg/dL — ABNORMAL HIGH (ref 70–99)
Glucose-Capillary: 163 mg/dL — ABNORMAL HIGH (ref 70–99)
Glucose-Capillary: 193 mg/dL — ABNORMAL HIGH (ref 70–99)

## 2013-02-21 LAB — PROTIME-INR: INR: 1.09 (ref 0.00–1.49)

## 2013-02-21 SURGERY — RELEASE, BURSA, TROCHANTERIC
Anesthesia: General | Site: Hip | Laterality: Right | Wound class: Clean

## 2013-02-21 MED ORDER — LIDOCAINE HCL (CARDIAC) 20 MG/ML IV SOLN
INTRAVENOUS | Status: DC | PRN
  Administered 2013-02-21: 80 mg via INTRAVENOUS

## 2013-02-21 MED ORDER — SIMVASTATIN 20 MG PO TABS
20.0000 mg | ORAL_TABLET | Freq: Every day | ORAL | Status: DC
  Administered 2013-02-21: 20 mg via ORAL
  Filled 2013-02-21 (×2): qty 1

## 2013-02-21 MED ORDER — BRIMONIDINE TARTRATE 0.15 % OP SOLN
1.0000 [drp] | Freq: Two times a day (BID) | OPHTHALMIC | Status: DC
  Filled 2013-02-21: qty 5

## 2013-02-21 MED ORDER — LINAGLIPTIN 5 MG PO TABS
5.0000 mg | ORAL_TABLET | Freq: Every day | ORAL | Status: DC
  Administered 2013-02-21 – 2013-02-22 (×2): 5 mg via ORAL
  Filled 2013-02-21 (×2): qty 1

## 2013-02-21 MED ORDER — LATANOPROST 0.005 % OP SOLN
1.0000 [drp] | Freq: Every day | OPHTHALMIC | Status: DC
  Filled 2013-02-21: qty 2.5

## 2013-02-21 MED ORDER — CHLORHEXIDINE GLUCONATE 4 % EX LIQD: 60.0000 mL | Freq: Once | CUTANEOUS | Status: DC

## 2013-02-21 MED ORDER — TRAMADOL HCL 50 MG PO TABS: 50.0000 mg | ORAL_TABLET | Freq: Four times a day (QID) | ORAL | Status: DC | PRN

## 2013-02-21 MED ORDER — PSEUDOEPHEDRINE HCL ER 120 MG PO TB12
120.0000 mg | ORAL_TABLET | Freq: Two times a day (BID) | ORAL | Status: DC
  Administered 2013-02-21 – 2013-02-22 (×2): 120 mg via ORAL
  Filled 2013-02-21 (×3): qty 1

## 2013-02-21 MED ORDER — LACTATED RINGERS IV SOLN: INTRAVENOUS | Status: DC

## 2013-02-21 MED ORDER — HYDROMORPHONE HCL PF 1 MG/ML IJ SOLN
INTRAMUSCULAR | Status: AC
  Filled 2013-02-21: qty 1

## 2013-02-21 MED ORDER — SUCCINYLCHOLINE CHLORIDE 20 MG/ML IJ SOLN
INTRAMUSCULAR | Status: DC | PRN
  Administered 2013-02-21: 100 mg via INTRAVENOUS

## 2013-02-21 MED ORDER — METHOCARBAMOL 100 MG/ML IJ SOLN
500.0000 mg | Freq: Four times a day (QID) | INTRAMUSCULAR | Status: DC | PRN
  Filled 2013-02-21: qty 5

## 2013-02-21 MED ORDER — ONDANSETRON HCL 4 MG PO TABS: 4.0000 mg | ORAL_TABLET | Freq: Four times a day (QID) | ORAL | Status: DC | PRN

## 2013-02-21 MED ORDER — FLEET ENEMA 7-19 GM/118ML RE ENEM: 1.0000 | ENEMA | Freq: Once | RECTAL | Status: AC | PRN

## 2013-02-21 MED ORDER — BUPIVACAINE LIPOSOME 1.3 % IJ SUSP
20.0000 mL | Freq: Once | INTRAMUSCULAR | Status: DC
  Filled 2013-02-21: qty 20

## 2013-02-21 MED ORDER — ACETAMINOPHEN 10 MG/ML IV SOLN
1000.0000 mg | Freq: Once | INTRAVENOUS | Status: AC
  Administered 2013-02-21: 1000 mg via INTRAVENOUS
  Filled 2013-02-21: qty 100

## 2013-02-21 MED ORDER — DOCUSATE SODIUM 100 MG PO CAPS
100.0000 mg | ORAL_CAPSULE | Freq: Two times a day (BID) | ORAL | Status: DC
  Administered 2013-02-21 – 2013-02-22 (×2): 100 mg via ORAL

## 2013-02-21 MED ORDER — VANCOMYCIN HCL IN DEXTROSE 1-5 GM/200ML-% IV SOLN
INTRAVENOUS | Status: AC
  Filled 2013-02-21: qty 200

## 2013-02-21 MED ORDER — SODIUM CHLORIDE 0.9 % IV SOLN: INTRAVENOUS | Status: DC

## 2013-02-21 MED ORDER — WARFARIN SODIUM 5 MG PO TABS
5.0000 mg | ORAL_TABLET | Freq: Once | ORAL | Status: AC
  Administered 2013-02-21: 5 mg via ORAL
  Filled 2013-02-21: qty 1

## 2013-02-21 MED ORDER — METOCLOPRAMIDE HCL 5 MG/ML IJ SOLN: 5.0000 mg | Freq: Three times a day (TID) | INTRAMUSCULAR | Status: DC | PRN

## 2013-02-21 MED ORDER — VANCOMYCIN HCL IN DEXTROSE 1-5 GM/200ML-% IV SOLN
1000.0000 mg | Freq: Two times a day (BID) | INTRAVENOUS | Status: AC
Start: 2013-02-22 — End: 2013-02-22
  Administered 2013-02-22: 02:00:00 1000 mg via INTRAVENOUS
  Filled 2013-02-21: qty 200

## 2013-02-21 MED ORDER — METHOCARBAMOL 500 MG PO TABS
500.0000 mg | ORAL_TABLET | Freq: Four times a day (QID) | ORAL | Status: DC | PRN
  Administered 2013-02-21: 500 mg via ORAL
  Filled 2013-02-21: qty 1

## 2013-02-21 MED ORDER — BUPIVACAINE HCL (PF) 0.25 % IJ SOLN
INTRAMUSCULAR | Status: AC
  Filled 2013-02-21: qty 30

## 2013-02-21 MED ORDER — AMLODIPINE BESYLATE 5 MG PO TABS
5.0000 mg | ORAL_TABLET | Freq: Every morning | ORAL | Status: DC
  Administered 2013-02-22: 11:00:00 5 mg via ORAL
  Filled 2013-02-21: qty 1

## 2013-02-21 MED ORDER — VANCOMYCIN HCL IN DEXTROSE 1-5 GM/200ML-% IV SOLN
1000.0000 mg | INTRAVENOUS | Status: AC
  Administered 2013-02-21: 1000 mg via INTRAVENOUS

## 2013-02-21 MED ORDER — WARFARIN - PHARMACIST DOSING INPATIENT: Freq: Every day | Status: DC

## 2013-02-21 MED ORDER — PROPOFOL 10 MG/ML IV BOLUS
INTRAVENOUS | Status: DC | PRN
  Administered 2013-02-21: 200 mg via INTRAVENOUS

## 2013-02-21 MED ORDER — SODIUM CHLORIDE 0.9 % IJ SOLN
INTRAMUSCULAR | Status: AC
  Filled 2013-02-21: qty 50

## 2013-02-21 MED ORDER — BUPIVACAINE-EPINEPHRINE 0.25% -1:200000 IJ SOLN
INTRAMUSCULAR | Status: DC | PRN
  Administered 2013-02-21: 20 mL

## 2013-02-21 MED ORDER — LACTATED RINGERS IV SOLN
INTRAVENOUS | Status: DC
  Administered 2013-02-21: 15:00:00 via INTRAVENOUS
  Administered 2013-02-21: 1000 mL via INTRAVENOUS
  Administered 2013-02-21: 14:00:00 via INTRAVENOUS

## 2013-02-21 MED ORDER — ONDANSETRON HCL 4 MG/2ML IJ SOLN
INTRAMUSCULAR | Status: DC | PRN
  Administered 2013-02-21: 4 mg via INTRAVENOUS

## 2013-02-21 MED ORDER — HYDROCODONE-ACETAMINOPHEN 5-325 MG PO TABS: 1.0000 | ORAL_TABLET | ORAL | Status: DC | PRN

## 2013-02-21 MED ORDER — METOCLOPRAMIDE HCL 10 MG PO TABS: 5.0000 mg | ORAL_TABLET | Freq: Three times a day (TID) | ORAL | Status: DC | PRN

## 2013-02-21 MED ORDER — BISACODYL 10 MG RE SUPP: 10.0000 mg | Freq: Every day | RECTAL | Status: DC | PRN

## 2013-02-21 MED ORDER — LORATADINE 10 MG PO TABS
10.0000 mg | ORAL_TABLET | Freq: Every day | ORAL | Status: DC
  Administered 2013-02-21 – 2013-02-22 (×2): 10 mg via ORAL
  Filled 2013-02-21 (×2): qty 1

## 2013-02-21 MED ORDER — HYDROMORPHONE HCL PF 1 MG/ML IJ SOLN
0.2500 mg | INTRAMUSCULAR | Status: DC | PRN
  Administered 2013-02-21 (×2): 0.5 mg via INTRAVENOUS

## 2013-02-21 MED ORDER — INSULIN ASPART 100 UNIT/ML ~~LOC~~ SOLN
0.0000 [IU] | Freq: Three times a day (TID) | SUBCUTANEOUS | Status: DC
  Administered 2013-02-22 (×2): 3 [IU] via SUBCUTANEOUS

## 2013-02-21 MED ORDER — FENOFIBRATE 160 MG PO TABS
160.0000 mg | ORAL_TABLET | Freq: Every day | ORAL | Status: DC
  Administered 2013-02-21 – 2013-02-22 (×2): 160 mg via ORAL
  Filled 2013-02-21 (×2): qty 1

## 2013-02-21 MED ORDER — POLYETHYLENE GLYCOL 3350 17 G PO PACK: 17.0000 g | PACK | Freq: Every day | ORAL | Status: DC | PRN

## 2013-02-21 MED ORDER — ONDANSETRON HCL 4 MG/2ML IJ SOLN: 4.0000 mg | Freq: Four times a day (QID) | INTRAMUSCULAR | Status: DC | PRN

## 2013-02-21 MED ORDER — SODIUM CHLORIDE 0.9 % IV SOLN
INTRAVENOUS | Status: DC
  Administered 2013-02-21: 17:00:00 via INTRAVENOUS

## 2013-02-21 MED ORDER — DEXAMETHASONE SODIUM PHOSPHATE 10 MG/ML IJ SOLN
10.0000 mg | Freq: Once | INTRAMUSCULAR | Status: AC
  Administered 2013-02-21: 10 mg via INTRAVENOUS

## 2013-02-21 MED ORDER — ALLOPURINOL 100 MG PO TABS
100.0000 mg | ORAL_TABLET | Freq: Every day | ORAL | Status: DC
  Administered 2013-02-22: 11:00:00 100 mg via ORAL
  Filled 2013-02-21: qty 1

## 2013-02-21 MED ORDER — ENOXAPARIN SODIUM 100 MG/ML ~~LOC~~ SOLN
100.0000 mg | Freq: Two times a day (BID) | SUBCUTANEOUS | Status: DC
  Administered 2013-02-22: 09:00:00 100 mg via SUBCUTANEOUS
  Filled 2013-02-21 (×3): qty 1

## 2013-02-21 MED ORDER — LORATADINE-PSEUDOEPHEDRINE ER 10-240 MG PO TB24: 1.0000 | ORAL_TABLET | Freq: Every day | ORAL | Status: DC

## 2013-02-21 MED ORDER — SODIUM CHLORIDE 0.9 % IJ SOLN
INTRAMUSCULAR | Status: DC | PRN
  Administered 2013-02-21: 15:00:00

## 2013-02-21 MED ORDER — FENTANYL CITRATE 0.05 MG/ML IJ SOLN
INTRAMUSCULAR | Status: DC | PRN
  Administered 2013-02-21 (×2): 25 ug via INTRAVENOUS
  Administered 2013-02-21 (×2): 50 ug via INTRAVENOUS
  Administered 2013-02-21 (×2): 100 ug via INTRAVENOUS

## 2013-02-21 SURGICAL SUPPLY — 43 items
ANCH SUT 2 CP-2 EBND QANCHR+ (Anchor) ×1 IMPLANT
ANCHOR SUPER QUICK (Anchor) ×2 IMPLANT
BAG ZIPLOCK 12X15 (MISCELLANEOUS) ×2 IMPLANT
BIT DRILL 2.8 QUICK RELEASE (BIT) IMPLANT
BLADE EXTENDED COATED 6.5IN (ELECTRODE) ×2 IMPLANT
CLOTH BEACON ORANGE TIMEOUT ST (SAFETY) IMPLANT
DRAPE INCISE IOBAN 66X45 STRL (DRAPES) ×2 IMPLANT
DRAPE ORTHO SPLIT 77X108 STRL (DRAPES) ×4
DRAPE POUCH INSTRU U-SHP 10X18 (DRAPES) ×2 IMPLANT
DRAPE SURG ORHT 6 SPLT 77X108 (DRAPES) ×2 IMPLANT
DRAPE U-SHAPE 47X51 STRL (DRAPES) ×2 IMPLANT
DRILL 2.8 QUICK RELEASE (BIT)
DRSG ADAPTIC 3X8 NADH LF (GAUZE/BANDAGES/DRESSINGS) ×2 IMPLANT
DRSG MEPILEX BORDER 4X4 (GAUZE/BANDAGES/DRESSINGS) ×2 IMPLANT
DRSG MEPILEX BORDER 4X8 (GAUZE/BANDAGES/DRESSINGS) ×2 IMPLANT
DURAPREP 26ML APPLICATOR (WOUND CARE) ×2 IMPLANT
ELECT REM PT RETURN 9FT ADLT (ELECTROSURGICAL) ×2
ELECTRODE REM PT RTRN 9FT ADLT (ELECTROSURGICAL) ×1 IMPLANT
GLOVE BIO SURGEON STRL SZ7.5 (GLOVE) ×2 IMPLANT
GLOVE BIO SURGEON STRL SZ8 (GLOVE) ×4 IMPLANT
GLOVE BIOGEL PI IND STRL 8 (GLOVE) ×3 IMPLANT
GLOVE BIOGEL PI INDICATOR 8 (GLOVE) ×3
GOWN PREVENTION PLUS LG XLONG (DISPOSABLE) IMPLANT
GOWN STRL REIN XL XLG (GOWN DISPOSABLE) ×4 IMPLANT
IV SET HUBERPLUS 22X1 SAFETY (NEEDLE) IMPLANT
KIT BASIN OR (CUSTOM PROCEDURE TRAY) ×2 IMPLANT
MANIFOLD NEPTUNE II (INSTRUMENTS) ×2 IMPLANT
NS IRRIG 1000ML POUR BTL (IV SOLUTION) ×2 IMPLANT
PACK TOTAL JOINT (CUSTOM PROCEDURE TRAY) ×2 IMPLANT
PASSER SUT SWANSON 36MM LOOP (INSTRUMENTS) IMPLANT
POSITIONER SURGICAL ARM (MISCELLANEOUS) ×2 IMPLANT
SPONGE GAUZE 4X4 12PLY (GAUZE/BANDAGES/DRESSINGS) ×2 IMPLANT
STAPLER VISISTAT 35W (STAPLE) IMPLANT
STRIP CLOSURE SKIN 1/2X4 (GAUZE/BANDAGES/DRESSINGS) ×2 IMPLANT
SUT ETHIBOND NAB CT1 #1 30IN (SUTURE) IMPLANT
SUT MNCRL AB 4-0 PS2 18 (SUTURE) ×2 IMPLANT
SUT VIC AB 1 CT1 27 (SUTURE)
SUT VIC AB 1 CT1 27XBRD ANTBC (SUTURE) IMPLANT
SUT VIC AB 2-0 CT1 27 (SUTURE) ×6
SUT VIC AB 2-0 CT1 TAPERPNT 27 (SUTURE) ×3 IMPLANT
SYR 30ML LL (SYRINGE) ×2 IMPLANT
TOWEL OR 17X26 10 PK STRL BLUE (TOWEL DISPOSABLE) ×2 IMPLANT
WATER STERILE IRR 1500ML POUR (IV SOLUTION) IMPLANT

## 2013-02-21 NOTE — Anesthesia Postprocedure Evaluation (Signed)
  Anesthesia Post-op Note  Patient: Brian [name].  Procedure(s) Performed: Procedure(s) (LRB): RIGHT HIP BURSECTOMY WITH TENDON REPAIR (Right)  Patient Location: PACU  Anesthesia Type: General  Level of Consciousness: awake and alert   Airway and Oxygen Therapy: Patient Spontanous Breathing  Post-op Pain: mild  Post-op Assessment: Post-op Vital signs reviewed, Patient's Cardiovascular Status Stable, Respiratory Function Stable, Patent Airway and No signs of Nausea or vomiting  Last Vitals:  Filed Vitals:   02/21/13 1615  BP: 146/58  Pulse: 81  Temp:   Resp: 15    Post-op Vital Signs: stable   Complications: No apparent anesthesia complications

## 2013-02-21 NOTE — Transfer of Care (Signed)
Immediate Anesthesia Transfer of Care Note  Patient: Brian [name].  Procedure(s) Performed: Procedure(s): RIGHT HIP BURSECTOMY WITH TENDON REPAIR (Right)  Patient Location: PACU  Anesthesia Type:General  Level of Consciousness: awake and alert   Airway & Oxygen Therapy: Patient Spontanous Breathing and Patient connected to face mask oxygen  Post-op Assessment: Report given to PACU RN and Post -op Vital signs reviewed and stable  Post vital signs: Reviewed and stable  Complications: No apparent anesthesia complications

## 2013-02-21 NOTE — Interval H&P Note (Signed)
History and Physical Interval Note:  02/21/2013 1:53 PM  Brian [name].  has presented today for surgery, with the diagnosis of RIGHT HIP GLUTEAL TENDON TEAR  The various methods of treatment have been discussed with the patient and family. After consideration of risks, benefits and other options for treatment, the patient has consented to  Procedure(s): RIGHT HIP BURSECTOMY WITH TENDON REPAIR (Right) as a surgical intervention .  The patient's history has been reviewed, patient examined, no change in status, stable for surgery.  I have reviewed the patient's chart and labs.  Questions were answered to the patient's satisfaction.     Loanne Drilling

## 2013-02-21 NOTE — Brief Op Note (Signed)
02/21/2013  3:16 PM  PATIENT:  Brian [name].  79 y.o. male  PRE-OPERATIVE DIAGNOSIS:  RIGHT HIP GLUTEAL TENDON TEAR  POST-OPERATIVE DIAGNOSIS:  right hip gluteal tendon tear  PROCEDURE:  Procedure(s): RIGHT HIP BURSECTOMY WITH TENDON REPAIR (Right)  SURGEON:  Surgeon(s) and Role:    * Loanne Drilling, MD - Primary  PHYSICIAN ASSISTANT:   ASSISTANTS: Avel Peace, PA-C   ANESTHESIA:   general  EBL:  Total I/O In: 1000 [I.V.:1000] Out: -   DRAINS: (Medium) Hemovact drain(s) in the right hip with  Suction Open   LOCAL MEDICATIONS USED:  OTHER Exparel  COUNTS:  YES  TOURNIQUET:  * No tourniquets in log *  DICTATION: .Other Dictation: Dictation Number [id number]  PLAN OF CARE: Admit for overnight observation  PATIENT DISPOSITION:  PACU - hemodynamically stable.

## 2013-02-21 NOTE — Progress Notes (Signed)
ANTICOAGULATION CONSULT NOTE - Initial Consult  Pharmacy Consult for Coumadin Indication: VTE prophylaxis  Allergies  Allergen Reactions  . Morphine And Related     SEVERE HALLUCINATIONS  . Penicillins     HIVES   Patient Measurements: Height: 5\' 6"  (167.6 cm) Weight: 219 lb (99.338 kg) IBW/kg (Calculated) : 63.8  Vital Signs: Temp: 97.4 F (36.3 C) (10/24 1724) Temp src: Oral (10/24 1149) BP: 145/75 mmHg (10/24 1724) Pulse Rate: 85 (10/24 1708)  Labs:  Recent Labs  02/21/13 1210  LABPROT 13.9  INR 1.09    Estimated Creatinine Clearance: 41.1 ml/min (by C-G formula based on Cr of 1.58).   Medical History: Past Medical History  Diagnosis Date  . Type II or unspecified type diabetes mellitus with renal manifestations, not stated as uncontrolled(250.40)   . Unspecified essential hypertension   . Other and unspecified hyperlipidemia   . Long term (current) use of anticoagulants   . Chronic kidney disease, stage III (moderate)   . Neuralgia, neuritis, and radiculitis, unspecified   . Obesity, unspecified   . Ankle fracture     right  . Leg fracture, right   . Glaucoma   . Obstructive sleep apnea (adult) (pediatric)     USING CPAP SETTING 7  . Acute venous embolism and thrombosis of unspecified deep vessels of lower extremity     15 OR 20 YRS AGO - RT LOWER LEG TWO DIFFERENT INCIDENTS OF CLOTS - CHRONIC COUMADIN SINCE  . Hernia     PT STATES HERNIA WHERE INCISION WAS FOR APPENDECTOMY AND INCISION MID ABDOMEN  . Arthritis     SPINE - STATES PAIN DOWN RT LEG  . Macular degeneration of both eyes     LEGALLY BLIND BOTH EYES -GETS MONTHLY INJECTIONS BOTH EYES- DR. MATTHEWS  . PONV (postoperative nausea and vomiting)     Medications:  Prescriptions prior to admission  Medication Sig Dispense Refill  . allopurinol (ZYLOPRIM) 100 MG tablet Take 100 mg by mouth daily.       Marland Kitchen amLODipine (NORVASC) 5 MG tablet Take 5 mg by mouth every morning.       . brimonidine  (ALPHAGAN) 0.15 % ophthalmic solution Place 1 drop into both eyes 2 (two) times daily.      . Calcium Carbonate-Vitamin D (CALCIUM + D PO) Take by mouth. DAILY      . enoxaparin (LOVENOX) 100 MG/ML injection Inject 100 mg into the skin every 12 (twelve) hours.       . fenofibrate 160 MG tablet Take 160 mg by mouth daily.      Marland Kitchen latanoprost (XALATAN) 0.005 % ophthalmic solution Place 1 drop into both eyes at bedtime.       Marland Kitchen linagliptin (TRADJENTA) 5 MG TABS tablet Take 5 mg by mouth daily.      Marland Kitchen lisinopril (PRINIVIL,ZESTRIL) 40 MG tablet Take 40 mg by mouth every morning.       . loratadine-pseudoephedrine (CLARITIN-D 24-HOUR) 10-240 MG per 24 hr tablet Take 1 tablet by mouth daily.      . Multiple Vitamins-Minerals (PRESERVISION AREDS PO) Take 1 tablet by mouth 2 (two) times daily.      . niacin (NIASPAN) 1000 MG CR tablet Take 1,000 mg by mouth at bedtime.      . simvastatin (ZOCOR) 20 MG tablet Take 20 mg by mouth at bedtime.       . Fenofibrate (LIPOFEN) 150 MG CAPS Take 1 capsule by mouth daily.      Marland Kitchen  warfarin (COUMADIN) 5 MG tablet Take 2.5-5 mg by mouth daily. Takes half tablet every day except Monday Wednesday and Friday he takes a whole tablet        Assessment: 79yo M s/p repair of right hip gluteal tendon tear. Patient is on chronic Coumadin for hx of DVT. Lovenox 1mg /kg q12h is ordered post-op and pharmacy is asked to Dose Coumadin.   Goal of Therapy:  INR 2-3 Monitor platelets by anticoagulation protocol: Yes   Plan:   Give Coumadin 5mg  tonight.  Daily PT/INR.  Plan dc Lovenox when INR in therapeutic range.  Charolotte Eke, PharmD, pager [phone]. 02/21/2013,6:08 PM.

## 2013-02-21 NOTE — Anesthesia Preprocedure Evaluation (Addendum)
Anesthesia Evaluation  Patient identified by MRN, date of birth, ID band Patient awake    Reviewed: Allergy & Precautions, H&P , NPO status , Patient's Chart, lab work & pertinent test results, reviewed documented beta blocker date and time   History of Anesthesia Complications (+) PONV  Airway Mallampati: III TM Distance: >3 FB Neck ROM: full    Dental no notable dental hx. (+) Edentulous Upper, Edentulous Lower and Dental Advisory Given   Pulmonary sleep apnea and Continuous Positive Airway Pressure Ventilation ,  breath sounds clear to auscultation  Pulmonary exam normal       Cardiovascular Exercise Tolerance: Good hypertension, Pt. on medications Rhythm:regular Rate:Normal     Neuro/Psych glaucoma negative neurological ROS  negative psych ROS   GI/Hepatic negative GI ROS, Neg liver ROS,   Endo/Other  diabetes, Well Controlled, Type 2  Renal/GU negative Renal ROS  negative genitourinary   Musculoskeletal   Abdominal   Peds  Hematology negative hematology ROS (+)   Anesthesia Other Findings On coumadin for dvt  Reproductive/Obstetrics negative OB ROS                         Anesthesia Physical Anesthesia Plan  ASA: III  Anesthesia Plan: General   Post-op Pain Management:    Induction: Intravenous  Airway Management Planned: Oral ETT  Additional Equipment:   Intra-op Plan:   Post-operative Plan: Extubation in OR  Informed Consent: I have reviewed the patients History and Physical, chart, labs and discussed the procedure including the risks, benefits and alternatives for the proposed anesthesia with the patient or authorized representative who has indicated his/her understanding and acceptance.   Dental Advisory Given  Plan Discussed with: CRNA and Surgeon  Anesthesia Plan Comments:         Anesthesia Quick Evaluation

## 2013-02-22 LAB — GLUCOSE, CAPILLARY
Glucose-Capillary: 183 mg/dL — ABNORMAL HIGH (ref 70–99)
Glucose-Capillary: 168 mg/dL — ABNORMAL HIGH (ref 70–99)

## 2013-02-22 MED ORDER — WARFARIN SODIUM 5 MG PO TABS
5.0000 mg | ORAL_TABLET | Freq: Once | ORAL | Status: AC
  Administered 2013-02-22: 11:00:00 5 mg via ORAL
  Filled 2013-02-22: qty 1

## 2013-02-22 MED ORDER — ENOXAPARIN SODIUM 100 MG/ML ~~LOC~~ SOLN: 100.0000 mg | Freq: Two times a day (BID) | SUBCUTANEOUS | Status: DC

## 2013-02-22 MED ORDER — HYDROCODONE-ACETAMINOPHEN 5-325 MG PO TABS: 1.0000 | ORAL_TABLET | ORAL | Status: DC | PRN

## 2013-02-22 MED ORDER — METHOCARBAMOL 500 MG PO TABS: 500.0000 mg | ORAL_TABLET | Freq: Four times a day (QID) | ORAL | Status: DC | PRN

## 2013-02-22 NOTE — Evaluation (Signed)
Physical Therapy Evaluation Patient Details Name: Brian [name]. MRN: [id number] DOB: May 23, 1930 Today's Date: 02/22/2013 Time: 8295-6213 PT Time Calculation (min): 33 min  PT Assessment / Plan / Recommendation History of Present Illness     Clinical Impression  Pt admitted for glut tendon repair and bursectomy of R hip and demonstrating basic mobility including ambulation with RW at Sup level.  Pt, spouse and dtr pleased with pts ability.  No follow up PT at this time.  Pt has all equipment needed at home.    PT Assessment  Patent does not need any further PT services    Follow Up Recommendations  No PT follow up    Does the patient have the potential to tolerate intense rehabilitation      Barriers to Discharge        Equipment Recommendations  None recommended by PT    Recommendations for Other Services     Frequency      Precautions / Restrictions Precautions Precautions: Fall Restrictions Weight Bearing Restrictions: No Other Position/Activity Restrictions: WBAT   Pertinent Vitals/Pain 3/10.      Mobility  Bed Mobility Bed Mobility: Supine to Sit Supine to Sit: 4: Min guard Details for Bed Mobility Assistance: cues for roll and sequencing Transfers Transfers: Sit to Stand;Stand to Sit Sit to Stand: 4: Min guard;From bed;From chair/3-in-1 Stand to Sit: 4: Min guard;To bed;To chair/3-in-1 Details for Transfer Assistance: cues for transition position and use of UEs to self assist Ambulation/Gait Ambulation/Gait Assistance: 4: Min guard Ambulation Distance (Feet): 150 Feet (and 50) Assistive device: Rolling walker Ambulation/Gait Assistance Details: cues for posture and position from RW Gait Pattern: Step-through pattern;Decreased step length - right;Decreased step length - left Stairs: Yes Stairs Assistance: 4: Min assist Stairs Assistance Details (indicate cue type and reason): cues for sequence and foot/cane placement Stair Management Technique:  One rail Right;Step to pattern;Forwards;With cane Number of Stairs: 4    Exercises     PT Diagnosis:    PT Problem List:   PT Treatment Interventions:       PT Goals(Current goals can be found in the care plan section) Acute Rehab PT Goals PT Goal Formulation: No goals set, d/c therapy  Visit Information  Last PT Received On: 02/22/13 Assistance Needed: +1       Prior Functioning  Home Living Family/patient expects to be discharged to:: Private residence Living Arrangements: Spouse/significant other Available Help at Discharge: Family Type of Home: House Home Access: Stairs to enter Secretary/administrator of Steps: 4 Entrance Stairs-Rails: Right Home Layout: One level Home Equipment: Environmental consultant - 2 wheels;Cane - single point Prior Function Level of Independence: Independent with assistive device(s) Communication Communication: HOH    Cognition  Cognition Arousal/Alertness: Awake/alert Behavior During Therapy: WFL for tasks assessed/performed Overall Cognitive Status: Within Functional Limits for tasks assessed    Extremity/Trunk Assessment Upper Extremity Assessment Upper Extremity Assessment: Overall WFL for tasks assessed Lower Extremity Assessment RLE Deficits / Details: ~3-/5 hip flex/ext,   Balance    End of Session PT - End of Session Equipment Utilized During Treatment: Gait belt Activity Tolerance: Patient tolerated treatment well Patient left: in chair;with call bell/phone within reach;with family/visitor present Nurse Communication: Mobility status  GP     Brian [name] 02/22/2013, 1:14 PM

## 2013-02-22 NOTE — Op Note (Signed)
NAME:  [name], Brian NO.:  [id number]  MEDICAL RECORD NO.:  [id number]  LOCATION:  1614                         FACILITY:  [facility]  PHYSICIAN:  Ollen Gross, M.D.    DATE OF BIRTH:  09/02/1930  DATE OF PROCEDURE:  02/21/2013 DATE OF DISCHARGE:                              OPERATIVE REPORT   PREOPERATIVE DIAGNOSIS:  Right hip gluteal tendon tear with intractable bursitis.  POSTOPERATIVE DIAGNOSIS:  Right hip gluteal tendon tear with intractable bursitis.  PROCEDURE:  Right hip bursectomy, gluteal tendon repair.  SURGEON:  Ollen Gross, MD  ASSISTANT:  Alexzandrew L. Perkins, PA-C  ANESTHESIA:  General.  ESTIMATED BLOOD LOSS:  Minimal.  DRAINS:  Hemovac x1.  COMPLICATIONS:  None.  CONDITION:  Stable to recovery.  BRIEF CLINICAL NOTE:  Brian [name] is an 79 year old male who had a total hip arthroplasty done several years ago.  He has had persistent lateral pain going into his leg.  It has been difficult to ascertain whether this pain is coming from the back or the hip.  He had a recent MRI showing no fluid collection, but did show attenuation of the gluteus medius tendon and possible small tear.  He also had bursitis and had a short-term response to an injection.  He presents now for bursectomy and tendon repair.  PROCEDURE IN DETAIL:  After successful administration of general anesthetic, the patient was placed in left lateral decubitus position with the right side up and held with a hip positioner.  Right lower extremity was isolated from his perineum with plastic drapes and prepped and draped in the usual sterile fashion.  Previous posterolateral incisions were utilized, skin cut with a 10 blade through subcutaneous tissue to the fascia lata, which was then incised in line with the skin incision.  There was a thickened bursa, but no fluid within the bursa. I removed the bursal sac.  There was no obvious tear in gluteus medius, but it felt as  though the tendon was rather thin especially centrally. I made a small longitudinal split in the tendon and indeed there were fibers in the center third of the tendon that were withdrawn off the greater trochanter.  Then __________ trochanter and passed the sutures through the area that had detached and then reattached that back to the greater trochanter.  There was also spur at the posterior aspect of the greater trochanter that I removed.  I oversewed the split in the tendon with a #1 Ethibond suture.  I was happy with the repair.  I did not inspect into the joint.  No evidence of any fluid in the joint.  No evidence of any metal staining or other abnormality.  The wound was then copiously irrigated with saline solution and the fascia lata closed over Hemovac drain with running #1 close suture.  The subcu was closed with interrupted 2-0 Vicryl and the subcuticular running 4-0 Monocryl.  The drain was hooked to suction.  Incision cleaned and dried, and Steri-Strips and bulky sterile dressing applied.  He was then awakened and transported to recovery in stable condition.     Ollen Gross, M.D.     FA/MEDQ  D:  02/21/2013  T:  02/21/2013  Job:  [id number]

## 2013-02-22 NOTE — Progress Notes (Signed)
   Subjective: 1 Day Post-Op Procedure(s) (LRB): RIGHT HIP BURSECTOMY WITH TENDON REPAIR (Right) Patient reports pain as mild.   Patient seen in rounds with Dr. Darrelyn Hillock. Patient is well, and has had no acute complaints or problems Patient is ready to go home  Objective: Vital signs in last 24 hours: Temp:  [97.3 F (36.3 C)-98.5 F (36.9 C)] 98.5 F (36.9 C) (10/25 0604) Pulse Rate:  [67-96] 73 (10/25 0604) Resp:  [13-20] 14 (10/25 0604) BP: (117-167)/(54-78) 146/68 mmHg (10/25 0604) SpO2:  [96 %-100 %] 96 % (10/25 0604) Weight:  [99.338 kg (219 lb)] 99.338 kg (219 lb) (10/24 1730)  Intake/Output from previous day:  Intake/Output Summary (Last 24 hours) at 02/22/13 0741 Last data filed at 02/22/13 0708  Gross per 24 hour  Intake   2625 ml  Output   2200 ml  Net    425 ml    Intake/Output this shift: Total I/O In: -  Out: 300 [Urine:300]  Labs: No results found for this basename: HGB,  in the last 72 hours No results found for this basename: WBC, RBC, HCT, PLT,  in the last 72 hours No results found for this basename: NA, K, CL, CO2, BUN, CREATININE, GLUCOSE, CALCIUM,  in the last 72 hours  Recent Labs  02/21/13 1210  INR 1.09    EXAM: General - Patient is Alert, Appropriate and Oriented Extremity - Neurovascular intact Sensation intact distally Dorsiflexion/Plantar flexion intact Incision - clean, dry Motor Function - intact, moving foot and toes well on exam.  Hemovac pulled  Assessment/Plan: 1 Day Post-Op Procedure(s) (LRB): RIGHT HIP BURSECTOMY WITH TENDON REPAIR (Right) Procedure(s) (LRB): RIGHT HIP BURSECTOMY WITH TENDON REPAIR (Right) Past Medical History  Diagnosis Date  . Type II or unspecified type diabetes mellitus with renal manifestations, not stated as uncontrolled(250.40)   . Unspecified essential hypertension   . Other and unspecified hyperlipidemia   . Long term (current) use of anticoagulants   . Chronic kidney disease, stage III  (moderate)   . Neuralgia, neuritis, and radiculitis, unspecified   . Obesity, unspecified   . Ankle fracture     right  . Leg fracture, right   . Glaucoma   . Obstructive sleep apnea (adult) (pediatric)     USING CPAP SETTING 7  . Acute venous embolism and thrombosis of unspecified deep vessels of lower extremity     15 OR 20 YRS AGO - RT LOWER LEG TWO DIFFERENT INCIDENTS OF CLOTS - CHRONIC COUMADIN SINCE  . Hernia     PT STATES HERNIA WHERE INCISION WAS FOR APPENDECTOMY AND INCISION MID ABDOMEN  . Arthritis     SPINE - STATES PAIN DOWN RT LEG  . Macular degeneration of both eyes     LEGALLY BLIND BOTH EYES -GETS MONTHLY INJECTIONS BOTH EYES- DR. MATTHEWS  . PONV (postoperative nausea and vomiting)    Active Problems:   * No active hospital problems. *  Estimated body mass index is 35.36 kg/(m^2) as calculated from the following:   Height as of this encounter: 5\' 6"  (1.676 m).   Weight as of this encounter: 99.338 kg (219 lb). Up with therapy Discharge home with home health Diet - Cardiac diet and Diabetic diet Follow up - in 2 weeks Activity - WBAT Disposition - Home Condition Upon Discharge - Good D/C Meds - See DC Summary DVT Prophylaxis - Coumadin  Cassara Nida 02/22/2013, 7:41 AM

## 2013-02-22 NOTE — Progress Notes (Addendum)
ANTICOAGULATION CONSULT NOTE - Follow Up Consult  Pharmacy Consult for Warfarin Indication: Hx of VTE, resume post-op repair R hip tendon  Allergies  Allergen Reactions  . Morphine And Related     SEVERE HALLUCINATIONS  . Penicillins     HIVES   Patient Measurements: Height: 5\' 6"  (167.6 cm) Weight: 219 lb (99.338 kg) IBW/kg (Calculated) : 63.8  Vital Signs: Temp: 98.5 F (36.9 C) (10/25 0604) Temp src: Oral (10/25 0604) BP: 146/68 mmHg (10/25 0604) Pulse Rate: 73 (10/25 0604)  Labs:  Recent Labs  02/21/13 1210 02/22/13 0648  LABPROT 13.9 13.6  INR 1.09 1.06   Estimated Creatinine Clearance: 41.1 ml/min (by C-G formula based on Cr of 1.58).  Medications:  Scheduled:  . allopurinol  100 mg Oral Daily  . amLODipine  5 mg Oral q morning - 10a  . brimonidine  1 drop Both Eyes BID  . docusate sodium  100 mg Oral BID  . enoxaparin  100 mg Subcutaneous Q12H  . fenofibrate  160 mg Oral Daily  . insulin aspart  0-15 Units Subcutaneous TID WC  . latanoprost  1 drop Both Eyes QHS  . linagliptin  5 mg Oral Daily  . loratadine  10 mg Oral Daily  . pseudoephedrine  120 mg Oral BID  . simvastatin  20 mg Oral QHS  . Warfarin - Pharmacist Dosing Inpatient   Does not apply q1800    Assessment: 74 yoM s/p R hip gluteal tendon tear/repair. Chronic Warfarin for hx DVT, home dose 5mg  MWF, 2.5 mg other days.  Baseline INR 1.09, Warfarin 5mg  yesterday  Lovenox bridge till INR therapeutic; 100mg  SQ q12  INR today 1.06  Regular diet, no emesis noted  Goal of Therapy:  INR 2-3   Plan:   Warfarin 5mg  tonight  Lovenox 100mg  SQ q12 till INR in therapeutic range  Daily PT/INR  Otho Bellows PharmD Pager [phone] 02/22/2013, 8:19 AM

## 2013-02-22 NOTE — Progress Notes (Signed)
Discharged from floor via w/c, family with pt. No changes in assessment. Zareah Hunzeker  

## 2013-02-24 ENCOUNTER — Encounter (HOSPITAL_COMMUNITY): Payer: Self-pay | Admitting: Orthopedic Surgery

## 2013-02-24 NOTE — Progress Notes (Signed)
Utilization review completed.  

## 2013-03-06 NOTE — Discharge Summary (Signed)
Physician Discharge Summary   Patient ID: Brian [name]. MRN: [id number] DOB/AGE: 06/13/1930 79 y.o.  Admit date: 02/21/2013 Discharge date: 02/22/2013  Primary Diagnosis:  Right hip gluteal tendon tear with intractable bursitis.  Admission Diagnoses:  Past Medical History  Diagnosis Date  . Type II or unspecified type diabetes mellitus with renal manifestations, not stated as uncontrolled(250.40)   . Unspecified essential hypertension   . Other and unspecified hyperlipidemia   . Long term (current) use of anticoagulants   . Chronic kidney disease, stage III (moderate)   . Neuralgia, neuritis, and radiculitis, unspecified   . Obesity, unspecified   . Ankle fracture     right  . Leg fracture, right   . Glaucoma   . Obstructive sleep apnea (adult) (pediatric)     USING CPAP SETTING 7  . Acute venous embolism and thrombosis of unspecified deep vessels of lower extremity     15 OR 20 YRS AGO - RT LOWER LEG TWO DIFFERENT INCIDENTS OF CLOTS - CHRONIC COUMADIN SINCE  . Hernia     PT STATES HERNIA WHERE INCISION WAS FOR APPENDECTOMY AND INCISION MID ABDOMEN  . Arthritis     SPINE - STATES PAIN DOWN RT LEG  . Macular degeneration of both eyes     LEGALLY BLIND BOTH EYES -GETS MONTHLY INJECTIONS BOTH EYES- DR. MATTHEWS  . PONV (postoperative nausea and vomiting)    Discharge Diagnoses:   Active Problems:   * No active hospital problems. *  Estimated body mass index is 35.36 kg/(m^2) as calculated from the following:   Height as of this encounter: 5\' 6"  (1.676 m).   Weight as of this encounter: 99.338 kg (219 lb).  Procedure(s) (LRB): RIGHT HIP BURSECTOMY WITH TENDON REPAIR (Right)   Consults: None  HPI: Brian [name] is an 79 year old male who had a total  hip arthroplasty done several years ago. He has had persistent lateral  pain going into his leg. It has been difficult to ascertain whether  this pain is coming from the back or the hip. He had a recent MRI  showing no  fluid collection, but did show attenuation of the gluteus  medius tendon and possible small tear. He also had bursitis and had a  short-term response to an injection. He presents now for bursectomy and  tendon repair.  Laboratory Data: Admission on 02/21/2013, Discharged on 02/22/2013  Component Date Value Range Status  . Prothrombin Time 02/21/2013 13.9  11.6 - 15.2 seconds Final  . INR 02/21/2013 1.09  0.00 - 1.49 Final  . Glucose-Capillary 02/21/2013 134* 70 - 99 mg/dL Final  . Glucose-Capillary 02/21/2013 163* 70 - 99 mg/dL Final  . Comment 1 40/98/1191 Documented in Chart   Final  . Comment 2 02/21/2013 Notify RN   Final  . Prothrombin Time 02/22/2013 13.6  11.6 - 15.2 seconds Final  . INR 02/22/2013 1.06  0.00 - 1.49 Final  . Glucose-Capillary 02/21/2013 193* 70 - 99 mg/dL Final  . Comment 1 47/82/9562 Notify RN   Final  . Glucose-Capillary 02/22/2013 183* 70 - 99 mg/dL Final  . Glucose-Capillary 02/22/2013 168* 70 - 99 mg/dL Final  Hospital Outpatient Visit on 02/17/2013  Component Date Value Range Status  . WBC 02/17/2013 6.2  4.0 - 10.5 K/uL Final  . RBC 02/17/2013 4.27  4.22 - 5.81 MIL/uL Final  . Hemoglobin 02/17/2013 13.0  13.0 - 17.0 g/dL Final  . HCT 13/11/6576 39.5  39.0 - 52.0 % Final  . MCV 02/17/2013  92.5  78.0 - 100.0 fL Final  . MCH 02/17/2013 30.4  26.0 - 34.0 pg Final  . MCHC 02/17/2013 32.9  30.0 - 36.0 g/dL Final  . RDW 16/01/9603 13.6  11.5 - 15.5 % Final  . Platelets 02/17/2013 185  150 - 400 K/uL Final  . Sodium 02/17/2013 137  135 - 145 mEq/L Final  . Potassium 02/17/2013 4.7  3.5 - 5.1 mEq/L Final   Comment: SLIGHT HEMOLYSIS                          HEMOLYSIS AT THIS LEVEL MAY AFFECT RESULT                          LIPEMIC SPECIMEN  . Chloride 02/17/2013 100  96 - 112 mEq/L Final  . CO2 02/17/2013 28  19 - 32 mEq/L Final  . Glucose, Bld 02/17/2013 136* 70 - 99 mg/dL Final  . BUN 54/12/8117 19  6 - 23 mg/dL Final  . Creatinine, Ser 02/17/2013 1.58*  0.50 - 1.35 mg/dL Final  . Calcium 14/78/2956 10.0  8.4 - 10.5 mg/dL Final  . GFR calc non Af Amer 02/17/2013 40* >90 mL/min Final  . GFR calc Af Amer 02/17/2013 46* >90 mL/min Final   Comment: (NOTE)                          The eGFR has been calculated using the CKD EPI equation.                          This calculation has not been validated in all clinical situations.                          eGFR's persistently <90 mL/min signify possible Chronic Kidney                          Disease.  Marland Kitchen Prothrombin Time 02/17/2013 22.9* 11.6 - 15.2 seconds Final  . INR 02/17/2013 2.10* 0.00 - 1.49 Final  . aPTT 02/17/2013 69* 24 - 37 seconds Final   Comment:                                 IF BASELINE aPTT IS ELEVATED,                          SUGGEST PATIENT RISK ASSESSMENT                          BE USED TO DETERMINE APPROPRIATE                          ANTICOAGULANT THERAPY.     X-Rays:Dg Chest 2 View  02/17/2013   CLINICAL DATA:  Preop for right hip surgery  EXAM: CHEST  2 VIEW  COMPARISON:  03/26/2007  FINDINGS: Cardiomediastinal silhouette is stable. Chronic mild elevation of the right hemidiaphragm. No acute infiltrate or pleural effusion. No pulmonary edema. Stable mild degenerative changes thoracic spine.  IMPRESSION: No active cardiopulmonary disease.   Electronically Signed   By: Brian [name] M.D.   On: 02/17/2013 15:47    EKG:  Orders placed during the hospital encounter of 02/21/13  . EKG     Hospital Course: Patient was admitted to [facility] and taken to the OR and underwent the above state procedure without complications.  Patient tolerated the procedure well and was later transferred to the recovery room and then to the orthopaedic floor for postoperative care.  They were given PO and IV analgesics for pain control following their surgery.  They were given 24 hours of postoperative antibiotics of  Anti-infectives   Start     Dose/Rate Route Frequency Ordered Stop    02/22/13 0200  vancomycin (VANCOCIN) IVPB 1000 mg/200 mL premix     1,000 mg 200 mL/hr over 60 Minutes Intravenous Every 12 hours 02/21/13 1746 02/22/13 0300   02/21/13 1146  vancomycin (VANCOCIN) IVPB 1000 mg/200 mL premix     1,000 mg 200 mL/hr over 60 Minutes Intravenous On call to O.R. 02/21/13 1146 02/21/13 1427     and started on DVT prophylaxis in the form of Coumadin.   PT was ordered for hip protocol.  The patient was allowed to be WBAT with therapy. Discharge planning was consulted to help with postop disposition and equipment needs.  Patient had a good night on the evening of surgery.  They started to get up OOB with therapy on day one.  Hemovac drain was pulled without difficulty.  Patient was seen in rounds with Dr. Darrelyn Hillock and was ready to go home later that day.   Discharge Medications: Prior to Admission medications   Medication Sig Start Date End Date Taking? Authorizing Provider  allopurinol (ZYLOPRIM) 100 MG tablet Take 100 mg by mouth daily.  10/17/12  Yes Historical Provider, MD  amLODipine (NORVASC) 5 MG tablet Take 5 mg by mouth every morning.  10/17/12  Yes Historical Provider, MD  brimonidine (ALPHAGAN) 0.15 % ophthalmic solution Place 1 drop into both eyes 2 (two) times daily.   Yes Historical Provider, MD  fenofibrate 160 MG tablet Take 160 mg by mouth daily.   Yes Historical Provider, MD  latanoprost (XALATAN) 0.005 % ophthalmic solution Place 1 drop into both eyes at bedtime.  09/10/12  Yes Historical Provider, MD  linagliptin (TRADJENTA) 5 MG TABS tablet Take 5 mg by mouth daily.   Yes Historical Provider, MD  lisinopril (PRINIVIL,ZESTRIL) 40 MG tablet Take 40 mg by mouth every morning.  10/17/12  Yes Historical Provider, MD  loratadine-pseudoephedrine (CLARITIN-D 24-HOUR) 10-240 MG per 24 hr tablet Take 1 tablet by mouth daily.   Yes Historical Provider, MD  niacin (NIASPAN) 1000 MG CR tablet Take 1,000 mg by mouth at bedtime.   Yes Historical Provider, MD    simvastatin (ZOCOR) 20 MG tablet Take 20 mg by mouth at bedtime.  10/17/12  Yes Historical Provider, MD  enoxaparin (LOVENOX) 100 MG/ML injection Inject 1 mL (100 mg total) into the skin every 12 (twelve) hours. Take for five more days at home and then discontinue. 02/22/13   Keyarah Mcroy Julien Girt, PA-C  Fenofibrate (LIPOFEN) 150 MG CAPS Take 1 capsule by mouth daily.    Historical Provider, MD  HYDROcodone-acetaminophen (NORCO/VICODIN) 5-325 MG per tablet Take 1-2 tablets by mouth every 4 (four) hours as needed. 02/22/13   Venus Ruhe, PA-C  methocarbamol (ROBAXIN) 500 MG tablet Take 1 tablet (500 mg total) by mouth every 6 (six) hours as needed. 02/22/13   Shere Eisenhart Julien Girt, PA-C  warfarin (COUMADIN) 5 MG tablet Take 2.5-5 mg by mouth daily. Takes half tablet every day except Monday Wednesday  and Friday he takes a whole tablet 10/17/12   Historical Provider, MD   Discharge home with home health  Diet - Cardiac diet and Diabetic diet  Follow up - in 2 weeks  Activity - WBAT  Disposition - Home  Condition Upon Discharge - Good  D/C Meds - See DC Summary  DVT Prophylaxis - Coumadin  Discharge Orders   Future Appointments Provider Department Dept Phone   03/13/2013 12:45 PM Sherrie George, MD [facility] [phone]   06/10/2013 11:30 AM [name], MD [facility] [phone]   Future Orders Complete By Expires   Call MD / Call 911  As directed    Comments:     If you experience chest pain or shortness of breath, CALL 911 and be transported to the hospital emergency room.  If you develope a fever above 101 F, pus (white drainage) or increased drainage or redness at the wound, or calf pain, call your surgeon's office.   Change dressing  As directed    Comments:     You may change your dressing dressing daily with sterile 4 x 4 inch gauze dressing and paper tape.  Do not submerge the incision under water.   Constipation Prevention  As  directed    Comments:     Drink plenty of fluids.  Prune juice may be helpful.  You may use a stool softener, such as Colace (over the counter) 100 mg twice a day.  Use MiraLax (over the counter) for constipation as needed.   Diet - low sodium heart healthy  As directed    Diet Carb Modified  As directed    Discharge instructions  As directed    Comments:     Pick up stool softner and laxative for home. Do not submerge incision under water. May shower. Continue to use ice for pain and swelling from surgery.  Take Lovenox injections for five more days at home and then discontinue. Resume home coumadin dosing at home.   Do not sit on low chairs, stoools or toilet seats, as it may be difficult to get up from low surfaces  As directed    Driving restrictions  As directed    Comments:     No driving until released by the physician.   Increase activity slowly as tolerated  As directed    Lifting restrictions  As directed    Comments:     No lifting until released by the physician.   Patient may shower  As directed    Comments:     You may shower without a dressing once there is no drainage.  Do not wash over the wound.  If drainage remains, do not shower until drainage stops.   TED hose  As directed    Comments:     Use stockings (TED hose) for 3 weeks on both leg(s).  You may remove them at night for sleeping.   Weight bearing as tolerated  As directed    Questions:     Laterality:     Extremity:         Medication List    STOP taking these medications       CALCIUM + D PO     PRESERVISION AREDS PO      TAKE these medications       allopurinol 100 MG tablet  Commonly known as:  ZYLOPRIM  Take 100 mg by mouth daily.     amLODipine  5 MG tablet  Commonly known as:  NORVASC  Take 5 mg by mouth every morning.     brimonidine 0.15 % ophthalmic solution  Commonly known as:  ALPHAGAN  Place 1 drop into both eyes 2 (two) times daily.     enoxaparin 100 MG/ML injection    Commonly known as:  LOVENOX  Inject 1 mL (100 mg total) into the skin every 12 (twelve) hours. Take for five more days at home and then discontinue.     HYDROcodone-acetaminophen 5-325 MG per tablet  Commonly known as:  NORCO/VICODIN  Take 1-2 tablets by mouth every 4 (four) hours as needed.     latanoprost 0.005 % ophthalmic solution  Commonly known as:  XALATAN  Place 1 drop into both eyes at bedtime.     linagliptin 5 MG Tabs tablet  Commonly known as:  TRADJENTA  Take 5 mg by mouth daily.     LIPOFEN 150 MG Caps  Generic drug:  Fenofibrate  Take 1 capsule by mouth daily.     fenofibrate 160 MG tablet  Take 160 mg by mouth daily.     lisinopril 40 MG tablet  Commonly known as:  PRINIVIL,ZESTRIL  Take 40 mg by mouth every morning.     loratadine-pseudoephedrine 10-240 MG per 24 hr tablet  Commonly known as:  CLARITIN-D 24-hour  Take 1 tablet by mouth daily.     methocarbamol 500 MG tablet  Commonly known as:  ROBAXIN  Take 1 tablet (500 mg total) by mouth every 6 (six) hours as needed.     niacin 1000 MG CR tablet  Commonly known as:  NIASPAN  Take 1,000 mg by mouth at bedtime.     simvastatin 20 MG tablet  Commonly known as:  ZOCOR  Take 20 mg by mouth at bedtime.     warfarin 5 MG tablet  Commonly known as:  COUMADIN  Take 2.5-5 mg by mouth daily. Takes half tablet every day except Monday Wednesday and Friday he takes a whole tablet           Follow-up Information   Follow up with Loanne Drilling, MD. Schedule an appointment as soon as possible for a visit in 2 weeks.   Specialty:  Orthopedic Surgery   Contact information:   [address] [place] [address] [phone]       Signed: Patrica Duel 03/06/2013, 5:40 PM

## 2013-03-13 ENCOUNTER — Encounter (INDEPENDENT_AMBULATORY_CARE_PROVIDER_SITE_OTHER): Payer: Medicare Other | Admitting: Ophthalmology

## 2013-03-13 DIAGNOSIS — H3530 Unspecified macular degeneration: Secondary | ICD-10-CM

## 2013-03-13 DIAGNOSIS — H43819 Vitreous degeneration, unspecified eye: Secondary | ICD-10-CM

## 2013-03-13 DIAGNOSIS — H35039 Hypertensive retinopathy, unspecified eye: Secondary | ICD-10-CM

## 2013-03-13 DIAGNOSIS — H353290 Exudative age-related macular degeneration, unspecified eye, stage unspecified: Secondary | ICD-10-CM

## 2013-04-16 ENCOUNTER — Encounter (INDEPENDENT_AMBULATORY_CARE_PROVIDER_SITE_OTHER): Payer: Medicare Other | Admitting: Ophthalmology

## 2013-04-16 DIAGNOSIS — H353290 Exudative age-related macular degeneration, unspecified eye, stage unspecified: Secondary | ICD-10-CM

## 2013-04-16 DIAGNOSIS — I10 Essential (primary) hypertension: Secondary | ICD-10-CM

## 2013-04-16 DIAGNOSIS — H35039 Hypertensive retinopathy, unspecified eye: Secondary | ICD-10-CM

## 2013-04-16 DIAGNOSIS — H43819 Vitreous degeneration, unspecified eye: Secondary | ICD-10-CM

## 2013-04-16 DIAGNOSIS — H3530 Unspecified macular degeneration: Secondary | ICD-10-CM

## 2013-05-19 IMAGING — NM NM BONE 3 PHASE
2 series · 12 of 12 positions shown · non-contrast
Comparison: Outside plain films 01/15/2012.

CLINICAL DATA: Right hip pain.  Total right hip replacement 5 years
ago.

[Series 1: fl flow and static · 4.75mm/px · 6 of 40 frames shown (1 of 2)]
[frame 4/40  full-range]
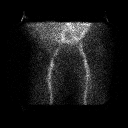
[frame 10/40  full-range]
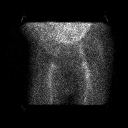
[frame 17/40  full-range]
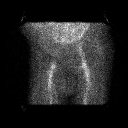
[frame 24/40  full-range]
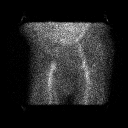
[frame 30/40  full-range]
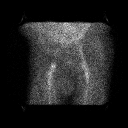
[frame 37/40  full-range]
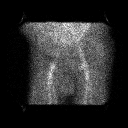

[Series 1: fl flow and static · 4.75mm/px · 6 of 40 frames shown (2 of 2)]
[frame 4/40  full-range]
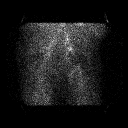
[frame 10/40  full-range]
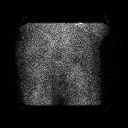
[frame 17/40  full-range]
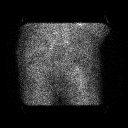
[frame 24/40  full-range]
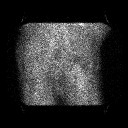
[frame 30/40  full-range]
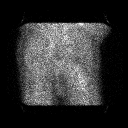
[frame 37/40  full-range]
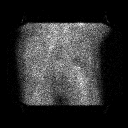

[12 of 12 positions shown; findings below may reference images not displayed]

FINDINGS: Triple phase bone scan with attention to the hips was
performed.

No abnormality noted on flow imaging or blood pool imaging.

On delayed imaging, there is increased radiotracer uptake
surrounding the right hip prosthesis most notable distally.  This
may represent loosening as versus stress reaction.  Infection not
excluded although felt to be a less likely consideration.
IMPRESSION: Findings raise possibility of loosening or stress phenomenon
surrounding the right hip prosthesis as noted above.

## 2013-06-10 ENCOUNTER — Encounter: Payer: Self-pay | Admitting: Neurology

## 2013-06-10 ENCOUNTER — Ambulatory Visit (INDEPENDENT_AMBULATORY_CARE_PROVIDER_SITE_OTHER): Payer: Medicare Other | Admitting: Neurology

## 2013-06-10 ENCOUNTER — Encounter (INDEPENDENT_AMBULATORY_CARE_PROVIDER_SITE_OTHER): Payer: Self-pay

## 2013-06-10 VITALS — BP 123/62 | HR 66 | Temp 99.5°F | Ht 67.0 in | Wt 220.0 lb

## 2013-06-10 DIAGNOSIS — E669 Obesity, unspecified: Secondary | ICD-10-CM

## 2013-06-10 DIAGNOSIS — G4733 Obstructive sleep apnea (adult) (pediatric): Secondary | ICD-10-CM

## 2013-06-10 DIAGNOSIS — H9190 Unspecified hearing loss, unspecified ear: Secondary | ICD-10-CM

## 2013-06-10 DIAGNOSIS — M25569 Pain in unspecified knee: Secondary | ICD-10-CM

## 2013-06-10 DIAGNOSIS — H547 Unspecified visual loss: Secondary | ICD-10-CM

## 2013-06-10 DIAGNOSIS — M25561 Pain in right knee: Secondary | ICD-10-CM

## 2013-06-10 NOTE — Progress Notes (Signed)
Subjective:    Patient ID: Brian [name]. is a 80 y.o. male.  HPI    Interim history:   Brian [name] is a very pleasant 80 year old right-handed gentleman with an underlying medical history of diabetes, hypertension, hyperlipidemia, gout, macular degeneration (monthly shots to R eye, legally blind on L), DVT twice with lifelong anticoagulation, glaucoma, and osteoarthritis (s/p R THR in 2008, s/p right hip bursectomy and gluteal tendon repair on 02/21/2013), who presents for followup consultation of his obstructive sleep apnea. He is accompanied by his wife again today. I last saw him on 02/10/2013, at which time we talked about his sleep study results as well as compliance data. He reported to soreness from the nasal pillows and had to change to a full face mask. He was still complaining of significant right hip pain and right knee pain as well at that time and was scheduled for his hip surgery.   Today, I reviewed compliance data from 02/05/2013 to 03/06/2013 which is a total of 30 days during which time he was CPAP every night except for 3 days. Percent used days greater than 4 hours was 83%, indicating very good compliance. Average usage for all days was 6 hours and 4 minutes. His pressure continues to be at 7 cm with EPR of 1. Residual AHI was 4.6 per hour and his leak was acceptable with the 95th percentile at 25.1 L per minute.   Today, he reports being compliant with CPAP treatment, but could not use it d/t a cold in December. He received antibiotic treatment. He had improvement in his R hip pain after surgery but did not complete HH PT or outpatient PT. He had an MRI to the R knee, and may need surgery, but is reluctant. He did not bring his machine or card today, so I do not have the most recent compliance data and we will request it. He received recent supplies, but has not changed the filter since he received the machine. He has had some mouth dryness. He does overall sleep better with  CPAP. He has a full face mask.  I first met him on 11/11/2012 and suggested a sleep study. He had a split-night sleep study on 11/19/2012: baseline sleep efficiency was reduced at 82.8% with a latency to sleep of 6.5 minutes and wake after sleep onset of 21.5 minutes with moderate sleep fragmentation noted. He had an increased percentage of stage I and stage II sleep, near absence of slow-wave sleep and a reduced percentage of REM sleep. He had a normal REM latency. He had a baseline AHI of 18.7/h, rising to 67.5/h in REM sleep. His baseline oxygen saturation was 95%, his nadir was 76% in REM sleep. He was titrated on CPAP from 5-7 cm with a reduction of his AHI to 0 events per hour at a pressure of 7. Supine REM sleep was achieved. Initial 30 day compliance data from 12/27/2012 through 01/25/2013 showed percent used days greater than 4 hours of 60%, suboptimal. His average usage for all days was 4 hours and 21 minutes. His residual AHI was 3.2 per hour on a CPAP pressure of 7 cm with EPR of 1.  Compliance data from 12/25/2012 through 02/10/2013 (48 days) showed: he used CPAP every day except 6 days. His percent used days greater than 4 hours was only 56%, suboptimal. Average usage for all days was 4 hours and 11 minutes. His residual AHI was 4 per hour indicating still a fairly appropriate pressure of 7  cm with EPR of 1.   His Past Medical History Is Significant For: Past Medical History  Diagnosis Date  . Type II or unspecified type diabetes mellitus with renal manifestations, not stated as uncontrolled   . Unspecified essential hypertension   . Other and unspecified hyperlipidemia   . Long term (current) use of anticoagulants   . Chronic kidney disease, stage III (moderate)   . Neuralgia, neuritis, and radiculitis, unspecified   . Obesity, unspecified   . Ankle fracture     right  . Leg fracture, right   . Glaucoma   . Obstructive sleep apnea (adult) (pediatric)     USING CPAP SETTING 7  .  Acute venous embolism and thrombosis of unspecified deep vessels of lower extremity     15 OR 20 YRS AGO - RT LOWER LEG TWO DIFFERENT INCIDENTS OF CLOTS - CHRONIC COUMADIN SINCE  . Hernia     PT STATES HERNIA WHERE INCISION WAS FOR APPENDECTOMY AND INCISION MID ABDOMEN  . Arthritis     SPINE - STATES PAIN DOWN RT LEG  . Macular degeneration of both eyes     LEGALLY BLIND BOTH EYES -GETS MONTHLY INJECTIONS BOTH EYES- DR. MATTHEWS  . PONV (postoperative nausea and vomiting)     His Past Surgical History Is Significant For: Past Surgical History  Procedure Laterality Date  . Appendectomy  1969  . Partial hip arthroplasty Right 2008  . Cataract extraction Bilateral 2013  . Eye surgery    . Excision/release bursa hip Right 02/21/2013    Procedure: RIGHT HIP BURSECTOMY WITH TENDON REPAIR;  Surgeon: Gearlean Alf, MD;  Location: [facility];  Service: Orthopedics;  Laterality: Right;    His Family History Is Significant For: Family History  Problem Relation Age of Onset  . Heart failure Brother   . Heart failure Father   . Heart failure Mother   . Stroke Mother   . Cancer Brother   . Cancer Sister   . Hypertension Daughter     His Social History Is Significant For: History   Social History  . Marital Status: Married    Spouse Name: N/A    Number of Children: N/A  . Years of Education: N/A   Social History Main Topics  . Smoking status: Former Smoker    Types: Pipe, Cigars    Quit date: 11/12/1962  . Smokeless tobacco: None  . Alcohol Use: No  . Drug Use: No  . Sexual Activity: None   Other Topics Concern  . None   Social History Narrative  . None    His Allergies Are:  Allergies  Allergen Reactions  . Morphine And Related     SEVERE HALLUCINATIONS  . Penicillins     HIVES  :   His Current Medications Are:  Outpatient Encounter Prescriptions as of 06/10/2013  Medication Sig  . allopurinol (ZYLOPRIM) 100 MG tablet Take 100 mg by mouth daily.   Marland Kitchen amLODipine  (NORVASC) 5 MG tablet Take 5 mg by mouth every morning.   . brimonidine (ALPHAGAN) 0.15 % ophthalmic solution Place 1 drop into both eyes 2 (two) times daily.  . fenofibrate 160 MG tablet Take 160 mg by mouth daily.  Marland Kitchen latanoprost (XALATAN) 0.005 % ophthalmic solution Place 1 drop into both eyes at bedtime.   Marland Kitchen linagliptin (TRADJENTA) 5 MG TABS tablet Take 5 mg by mouth daily.  Marland Kitchen lisinopril (PRINIVIL,ZESTRIL) 40 MG tablet Take 40 mg by mouth every morning.   . loratadine-pseudoephedrine (CLARITIN-D  24-HOUR) 10-240 MG per 24 hr tablet Take 1 tablet by mouth daily.  . niacin (NIASPAN) 1000 MG CR tablet Take 1,000 mg by mouth at bedtime.  . simvastatin (ZOCOR) 20 MG tablet Take 20 mg by mouth at bedtime.   Marland Kitchen warfarin (COUMADIN) 5 MG tablet Take 2.5-5 mg by mouth daily. Takes half tablet every day except Monday Wednesday and Friday he takes a whole tablet  . [DISCONTINUED] enoxaparin (LOVENOX) 100 MG/ML injection Inject 1 mL (100 mg total) into the skin every 12 (twelve) hours. Take for five more days at home and then discontinue.  . [DISCONTINUED] Fenofibrate (LIPOFEN) 150 MG CAPS Take 1 capsule by mouth daily.  . [DISCONTINUED] HYDROcodone-acetaminophen (NORCO/VICODIN) 5-325 MG per tablet Take 1-2 tablets by mouth every 4 (four) hours as needed.  . [DISCONTINUED] methocarbamol (ROBAXIN) 500 MG tablet Take 1 tablet (500 mg total) by mouth every 6 (six) hours as needed.  :  Review of Systems:  Out of a complete 14 point review of systems, all are reviewed and negative with the exception of these symptoms as listed below: Review of Systems  Constitutional: Negative.   HENT: Negative.   Eyes: Positive for visual disturbance (loss of vision).  Respiratory: Negative.   Cardiovascular: Negative.   Gastrointestinal: Negative.   Endocrine: Negative.   Genitourinary: Negative.   Musculoskeletal: Negative.   Skin: Negative.   Allergic/Immunologic: Negative.   Neurological: Negative.    Hematological: Negative.   Psychiatric/Behavioral: Positive for sleep disturbance (apnea).    Objective:  Neurologic Exam  Physical Exam Physical Examination:   Filed Vitals:   06/10/13 1059  BP: 123/62  Pulse: 66  Temp: 99.5 F (37.5 C)    General Examination: The patient is a very pleasant 80 y.o. male in no acute distress. He appears well-developed and well-nourished and well groomed.   HEENT: Normocephalic, atraumatic, pupils are equal, round and reactive to light and accommodation. Extraocular tracking is good without limitation to gaze excursion or nystagmus noted. Normal smooth pursuit is noted. Vision is impaired bilaterally. Hearing is impaired wth hearing aids in place bilaterally. Tympanic membranes are clear bilaterally. Face is symmetric with normal facial animation and normal facial sensation. Speech is clear with no dysarthria noted. There is no hypophonia. There is no lip, neck/head, jaw or voice tremor. Neck is supple with full range of passive and active motion. There are no carotid bruits on auscultation. Oropharynx exam reveals: mild mouth dryness, adequate dental hygiene and moderate airway crowding, due to floppy soft palate. Mallampati is class II. Tongue protrudes centrally and palate elevates symmetrically. Tonsils are 1+.   Chest: Clear to auscultation without wheezing, rhonchi or crackles noted.  Heart: S1+S2+0, regular and normal without murmurs, rubs or gallops noted.   Abdomen: Soft, non-tender and non-distended with normal bowel sounds appreciated on auscultation.  Extremities: There is 1+ pitting edema in the distal lower extremities bilaterally around the ankles and up to mid shin areas. This has become worse from last time. Pedal pulses are intact.  Skin: Warm and dry without trophic changes noted. There are no varicose veins.  Musculoskeletal: exam reveals no obvious joint deformities, tenderness or joint swelling or erythema.   Neurologically:   Mental status: The patient is awake, alert and oriented in all 4 spheres. His memory, attention, language and knowledge are appropriate. There is no aphasia, agnosia, apraxia or anomia. Speech is clear with normal prosody and enunciation. Thought process is linear. Mood is congruent and affect is normal.  Cranial nerves  are as described above under HEENT exam. In addition, shoulder shrug is normal with equal shoulder height noted. Motor exam: Normal bulk, strength and tone is noted. There is no drift, tremor or rebound. Romberg is negative. Reflexes are 2+ throughout. Fine motor skills are intact with normal finger taps, normal hand movements, normal rapid alternating patting, normal foot taps and normal foot agility.  Cerebellar testing shows no dysmetria or intention tremor on finger to nose testing. There is no truncal or gait ataxia.  Sensory exam is intact to light touch, pinprick, vibration sense as well as temperature sense in the upper and lower extremities. Gait, station and balance: he stands up slowly and has to push himself up and he is hurting in his R > L knees and walks slowly with a single prong cane. No veering to one side is noted. No leaning to one side is noted. Posture is age-appropriate and stance is slightly wide based.   Assessment and Plan:   In summary, Brian Majkowski. is a very pleasant 80 year old male with an underlying medical history of diabetes, hypertension, hyperlipidemia, gout, macular degeneration (monthly shots to R eye, legally blind on L), DVT twice with lifelong anticoagulation, glaucoma, and osteoarthritis (s/p R THR in 2008, s/p right hip bursectomy and gluteal tendon repair on 02/21/2013), who presents for followup consultation of his obstructive sleep apnea, on CPAP treatment at a pressure of 7 cwp. His physical exam is stable and He indicates good results with the use of CPAP, and adequate tolerance of the pressure and mask. I reviewed the compliance data  available with the patient and  his wife and encouraged him to continue to use CPAP regularly to help reduce cardiovascular risk. He is particularly advised not skip any days. He recently had to skip days because he had a cold. He still has residual right right knee pain but has had improvement of his right hip pain after his surgery in October 2014. He is advised to do the following: I would like for him to take his machine into his DME company to have him change the filter and supply him with replacement filters. He needs to be able to change his humidifier setting and they are currently not familiar as to how to change it. His wife they have to do all of this for him because he cannot drive himself and he is vision impaired. He also needs to get a new compliance card and we need to get the most recent compliance data faxed to [place].  We also talked about trying to maintaining a healthy lifestyle in general. I encouraged the patient to eat healthy, exercise daily and keep well hydrated, to keep a scheduled bedtime and wake time routine, to not skip any meals and eat healthy snacks in between meals and to have protein with every meal. I stressed the importance of regular exercise, within of course the patient's physical and visual limitations. I answered all their questions today and the patient and his wife were in agreement with the above outlined plan. I would like to see the patient back in 6 months, sooner if the need arises and encouraged them to call with any interim questions, concerns, problems or updates.

## 2013-06-10 NOTE — Patient Instructions (Addendum)
Please continue using your CPAP regularly. While your insurance requires that you use CPAP at least 4 hours each night on 70% of the nights, I recommend, that you not skip any nights and use it throughout the night if you can. Getting used to CPAP does take time and patience and discipline. Untreated obstructive sleep apnea when it is moderate to severe can have an adverse impact on cardiovascular health and raise her risk for heart disease, arrhythmias, hypertension, congestive heart failure, stroke and diabetes. Untreated obstructive sleep apnea causes sleep disruption, nonrestorative sleep, and sleep deprivation. This can have an impact on your day to day functioning and cause daytime sleepiness and impairment of cognitive function, memory loss, mood disturbance, and problems focussing. Using CPAP regularly can improve these symptoms.  Please take your machine to [facility]: you need a new filter, you need to have them take you SD card and give you a new one to put in your machine, and you need to have them show you how to change the humidifier setting.   Follow up with me in 6 months.

## 2013-06-11 ENCOUNTER — Encounter (INDEPENDENT_AMBULATORY_CARE_PROVIDER_SITE_OTHER): Payer: Medicare Other | Admitting: Ophthalmology

## 2013-06-11 DIAGNOSIS — H35039 Hypertensive retinopathy, unspecified eye: Secondary | ICD-10-CM

## 2013-06-11 DIAGNOSIS — H43819 Vitreous degeneration, unspecified eye: Secondary | ICD-10-CM

## 2013-06-11 DIAGNOSIS — H3530 Unspecified macular degeneration: Secondary | ICD-10-CM

## 2013-06-11 DIAGNOSIS — I10 Essential (primary) hypertension: Secondary | ICD-10-CM

## 2013-07-30 ENCOUNTER — Encounter (INDEPENDENT_AMBULATORY_CARE_PROVIDER_SITE_OTHER): Payer: Medicare Other | Admitting: Ophthalmology

## 2013-07-30 DIAGNOSIS — H3530 Unspecified macular degeneration: Secondary | ICD-10-CM

## 2013-07-30 DIAGNOSIS — H35039 Hypertensive retinopathy, unspecified eye: Secondary | ICD-10-CM

## 2013-07-30 DIAGNOSIS — I10 Essential (primary) hypertension: Secondary | ICD-10-CM

## 2013-07-30 DIAGNOSIS — H43819 Vitreous degeneration, unspecified eye: Secondary | ICD-10-CM

## 2013-09-24 ENCOUNTER — Encounter (INDEPENDENT_AMBULATORY_CARE_PROVIDER_SITE_OTHER): Payer: Medicare Other | Admitting: Ophthalmology

## 2013-09-24 DIAGNOSIS — H43819 Vitreous degeneration, unspecified eye: Secondary | ICD-10-CM

## 2013-09-24 DIAGNOSIS — H3530 Unspecified macular degeneration: Secondary | ICD-10-CM

## 2013-09-24 DIAGNOSIS — H35039 Hypertensive retinopathy, unspecified eye: Secondary | ICD-10-CM

## 2013-09-24 DIAGNOSIS — I10 Essential (primary) hypertension: Secondary | ICD-10-CM

## 2013-12-09 ENCOUNTER — Ambulatory Visit: Payer: Medicare Other | Admitting: Neurology

## 2013-12-17 ENCOUNTER — Encounter (INDEPENDENT_AMBULATORY_CARE_PROVIDER_SITE_OTHER): Payer: Medicare Other | Admitting: Ophthalmology

## 2013-12-17 DIAGNOSIS — H3530 Unspecified macular degeneration: Secondary | ICD-10-CM

## 2013-12-17 DIAGNOSIS — H35039 Hypertensive retinopathy, unspecified eye: Secondary | ICD-10-CM

## 2013-12-17 DIAGNOSIS — H43819 Vitreous degeneration, unspecified eye: Secondary | ICD-10-CM

## 2013-12-17 DIAGNOSIS — I10 Essential (primary) hypertension: Secondary | ICD-10-CM

## 2013-12-22 ENCOUNTER — Encounter: Payer: Self-pay | Admitting: Neurology

## 2014-01-02 ENCOUNTER — Telehealth: Payer: Self-pay | Admitting: Neurology

## 2014-01-02 NOTE — Telephone Encounter (Signed)
Patient's wife calling to reschedule patient's no show appointment on 12/09/13, patient's wife did not want to wait until first available in December, please return call and advise.

## 2014-01-02 NOTE — Telephone Encounter (Signed)
Called patient and spoke with daughter to schedule sooner appt., cannot come in on that date, will place on wait list , she verbalized understanding

## 2014-01-07 ENCOUNTER — Telehealth: Payer: Self-pay | Admitting: Neurology

## 2014-01-07 NOTE — Telephone Encounter (Signed)
Called patient and tried to scheduled 2 appt.times but daughter said she is unable to bring patient either time. Informed  To daughter that the patient will stay on wait list for first available, she verbalized understanding and said ok

## 2014-01-27 ENCOUNTER — Other Ambulatory Visit: Payer: Self-pay | Admitting: Orthopedic Surgery

## 2014-01-27 DIAGNOSIS — M48061 Spinal stenosis, lumbar region without neurogenic claudication: Secondary | ICD-10-CM

## 2014-02-13 ENCOUNTER — Ambulatory Visit
Admission: RE | Admit: 2014-02-13 | Discharge: 2014-02-13 | Disposition: A | Discharge status: Home / Self Care | Payer: Medicare Other | Source: Ambulatory Visit | Attending: Orthopedic Surgery | Admitting: Orthopedic Surgery

## 2014-02-13 DIAGNOSIS — M48061 Spinal stenosis, lumbar region without neurogenic claudication: Secondary | ICD-10-CM

## 2014-02-13 MED ORDER — IOHEXOL 180 MG/ML  SOLN
1.0000 mL | Freq: Once | INTRAMUSCULAR | Status: AC | PRN
  Administered 2014-02-13: 1 mL via EPIDURAL

## 2014-02-13 MED ORDER — METHYLPREDNISOLONE ACETATE 40 MG/ML INJ SUSP (RADIOLOG
120.0000 mg | Freq: Once | INTRAMUSCULAR | Status: AC
  Administered 2014-02-13: 120 mg via EPIDURAL

## 2014-02-13 NOTE — Discharge Instructions (Signed)

## 2014-03-18 ENCOUNTER — Encounter (INDEPENDENT_AMBULATORY_CARE_PROVIDER_SITE_OTHER): Payer: Medicare Other | Admitting: Ophthalmology

## 2014-03-18 DIAGNOSIS — H35033 Hypertensive retinopathy, bilateral: Secondary | ICD-10-CM

## 2014-03-18 DIAGNOSIS — H3531 Nonexudative age-related macular degeneration: Secondary | ICD-10-CM

## 2014-03-18 DIAGNOSIS — I10 Essential (primary) hypertension: Secondary | ICD-10-CM

## 2014-03-18 DIAGNOSIS — H43813 Vitreous degeneration, bilateral: Secondary | ICD-10-CM

## 2014-03-22 ENCOUNTER — Emergency Department (HOSPITAL_COMMUNITY)
Admission: EM | Admit: 2014-03-22 | Discharge: 2014-03-31 | Disposition: E | Payer: Medicare Other | Attending: Emergency Medicine | Admitting: Emergency Medicine

## 2014-03-22 ENCOUNTER — Encounter (HOSPITAL_COMMUNITY): Payer: Self-pay | Admitting: *Deleted

## 2014-03-22 DIAGNOSIS — Z8669 Personal history of other diseases of the nervous system and sense organs: Secondary | ICD-10-CM | POA: Insufficient documentation

## 2014-03-22 DIAGNOSIS — I129 Hypertensive chronic kidney disease with stage 1 through stage 4 chronic kidney disease, or unspecified chronic kidney disease: Secondary | ICD-10-CM | POA: Insufficient documentation

## 2014-03-22 DIAGNOSIS — E669 Obesity, unspecified: Secondary | ICD-10-CM | POA: Diagnosis not present

## 2014-03-22 DIAGNOSIS — N183 Chronic kidney disease, stage 3 (moderate): Secondary | ICD-10-CM | POA: Insufficient documentation

## 2014-03-22 DIAGNOSIS — Z87891 Personal history of nicotine dependence: Secondary | ICD-10-CM | POA: Insufficient documentation

## 2014-03-22 DIAGNOSIS — Z8739 Personal history of other diseases of the musculoskeletal system and connective tissue: Secondary | ICD-10-CM | POA: Diagnosis not present

## 2014-03-22 DIAGNOSIS — I469 Cardiac arrest, cause unspecified: Secondary | ICD-10-CM | POA: Diagnosis not present

## 2014-03-22 DIAGNOSIS — Z7901 Long term (current) use of anticoagulants: Secondary | ICD-10-CM | POA: Diagnosis not present

## 2014-03-22 DIAGNOSIS — E1122 Type 2 diabetes mellitus with diabetic chronic kidney disease: Secondary | ICD-10-CM | POA: Insufficient documentation

## 2014-03-22 DIAGNOSIS — H409 Unspecified glaucoma: Secondary | ICD-10-CM | POA: Insufficient documentation

## 2014-03-22 DIAGNOSIS — Z87311 Personal history of (healed) other pathological fracture: Secondary | ICD-10-CM | POA: Insufficient documentation

## 2014-03-22 NOTE — ED Provider Notes (Signed)
CSN: [id number]     Arrival date & time 03/13/2014  2223 History   First MD Initiated Contact with Patient 03/11/2014 2230     Chief Complaint  Patient presents with  . cpr      (Consider location/radiation/quality/duration/timing/severity/associated sxs/prior Treatment) HPI Patient is an 80 year old male presenting in cardiac arrest. History is provided by EMS. EMS reports that they were called out to the patient's house for shortness of breath. On arrival they found the patient dyspneic cool and clammy complaining of shortness of breath and abdominal pain. Shortly after making patient contact the patient be began to develop thready pulses, and became bradycardic. While in route to the hospital, they lost pulses and CPR was initiated. The patient received 7 rounds of epinephrine, was shocked twice for V. fib, and pacing was attempted without capture. He never had return of spontaneous circulation.  Past Medical History  Diagnosis Date  . Type II or unspecified type diabetes mellitus with renal manifestations, not stated as uncontrolled   . Unspecified essential hypertension   . Other and unspecified hyperlipidemia   . Long term (current) use of anticoagulants   . Chronic kidney disease, stage III (moderate)   . Neuralgia, neuritis, and radiculitis, unspecified   . Obesity, unspecified   . Ankle fracture     right  . Leg fracture, right   . Glaucoma   . Obstructive sleep apnea (adult) (pediatric)     USING CPAP SETTING 7  . Acute venous embolism and thrombosis of unspecified deep vessels of lower extremity     15 OR 20 YRS AGO - RT LOWER LEG TWO DIFFERENT INCIDENTS OF CLOTS - CHRONIC COUMADIN SINCE  . Hernia     PT STATES HERNIA WHERE INCISION WAS FOR APPENDECTOMY AND INCISION MID ABDOMEN  . Arthritis     SPINE - STATES PAIN DOWN RT LEG  . Macular degeneration of both eyes     LEGALLY BLIND BOTH EYES -GETS MONTHLY INJECTIONS BOTH EYES- DR. MATTHEWS  . PONV (postoperative nausea and  vomiting)    Past Surgical History  Procedure Laterality Date  . Appendectomy  1969  . Partial hip arthroplasty Right 2008  . Cataract extraction Bilateral 2013  . Eye surgery    . Excision/release bursa hip Right 02/21/2013    Procedure: RIGHT HIP BURSECTOMY WITH TENDON REPAIR;  Surgeon: Loanne DrillingFrank V Aluisio, MD;  Location: [facility];  Service: Orthopedics;  Laterality: Right;   Family History  Problem Relation Age of Onset  . Heart failure Brother   . Heart failure Father   . Heart failure Mother   . Stroke Mother   . Cancer Brother   . Cancer Sister   . Hypertension Daughter    History  Substance Use Topics  . Smoking status: Former Smoker    Types: Pipe, Cigars    Quit date: 11/12/1962  . Smokeless tobacco: Not on file  . Alcohol Use: No    Review of Systems  Unable to perform ROS     Allergies  Morphine and related and Penicillins  Home Medications   Prior to Admission medications   Medication Sig Start Date End Date Taking? Authorizing Provider  allopurinol (ZYLOPRIM) 100 MG tablet Take 100 mg by mouth daily.  10/17/12   Historical Provider, MD  amLODipine (NORVASC) 5 MG tablet Take 5 mg by mouth every morning.  10/17/12   Historical Provider, MD  brimonidine (ALPHAGAN) 0.15 % ophthalmic solution Place 1 drop into both eyes 2 (two) times daily.  Historical Provider, MD  fenofibrate 160 MG tablet Take 160 mg by mouth daily.    Historical Provider, MD  latanoprost (XALATAN) 0.005 % ophthalmic solution Place 1 drop into both eyes at bedtime.  09/10/12   Historical Provider, MD  linagliptin (TRADJENTA) 5 MG TABS tablet Take 5 mg by mouth daily.    Historical Provider, MD  lisinopril (PRINIVIL,ZESTRIL) 40 MG tablet Take 40 mg by mouth every morning.  10/17/12   Historical Provider, MD  loratadine-pseudoephedrine (CLARITIN-D 24-HOUR) 10-240 MG per 24 hr tablet Take 1 tablet by mouth daily.    Historical Provider, MD  niacin (NIASPAN) 1000 MG CR tablet Take 1,000 mg by mouth  at bedtime.    Historical Provider, MD  simvastatin (ZOCOR) 20 MG tablet Take 20 mg by mouth at bedtime.  10/17/12   Historical Provider, MD  warfarin (COUMADIN) 5 MG tablet Take 2.5-5 mg by mouth daily. Takes half tablet every day except Monday Wednesday and Friday he takes a whole tablet 10/17/12   Historical Provider, MD   BP 0/0 mmHg  Pulse 0  Resp 0  Wt 204 lb (92.534 kg)  SpO2 0% Physical Exam  Constitutional: He appears toxic. He is intubated.  HENT:  Head: Normocephalic and atraumatic.  Eyes:  Fixed and dilated  Neck: No JVD present.  Cardiovascular:  Pulseless  Pulmonary/Chest: He is intubated.  No respiratory effort, breath sounds present bilaterally  Abdominal: He exhibits distension.  Neurological:  Unresponsive  Skin: There is pallor.  Cool  Nursing note and vitals reviewed.   ED Course  Procedures (including critical care time) Labs Review Labs Reviewed - No data to display  Imaging Review No results found.   EKG Interpretation None       EMERGENCY DEPARTMENT US CARDIAC EXAM "Study: Limited Ultrasound of the heart and pericardium"  INDICATIONS:Cardiac arrest Multiple views of the heart and pericardium were obtained in real-time with a multi-frequency probe.  PERFORMED RU:EAVWUJBY:Myself  IMAGES ARCHIVED?: No  FINDINGS: No cardiac activity  LIMITATIONS:  Emergent procedure  VIEWS USED: Parasternal long axis  INTERPRETATION: Cardiac activity absent    MDM   Final diagnoses:  Cardiac arrest    80 year old male, presenting in cardiac arrest. Received multiple rounds of ACLS prior to arrival, no ROSC. Arrives with the endotracheal tube in appropriate position, breath sounds equal bilaterally. Pupils fixed and dilated. Blood glucose checked prior to arrival, no hyperglycemia. Ultrasound was performed during pulse checked and showed complete cardiac standstill. No reversible causes of his cardiac arrest were identified. There is brought prolonged down  time, further resuscitative efforts were deemed futile, the patient was pronounced dead at 2227.  Erskine Emeryhris Duana Benedict, MD 03/24/2014 [phone]

## 2014-03-22 NOTE — ED Notes (Addendum)
The pt arrived with cpr in progress by gems since 2127.  [facility].  The pt became unrersponisve at home.  Initially when ems arrived he was responding to pain.  The pt had 7 epines amiodarone 300mg  d50 w 50ml.  Shocked x 3  rhyms asystole vf pea.  Pronounced on arrival by dr bednar 2227.  Pt on the lucas on arrival   Io rt tibia by ems

## 2014-03-22 NOTE — ED Notes (Signed)
Bedside us by ed res showing no heart activity

## 2014-03-22 NOTE — ED Provider Notes (Signed)
I saw and evaluated the patient, reviewed the resident's note and I agree with the findings and plan.  DOA time of death 2227  Asystole, POCUS cardiac standstill  Multiple ACLS interventions by EMS prior to arrival  [facility] agrees send death certificate to Dr. Clelia CroftShaw for signature. 2240  Hurman HornJohn M Zyhir Cappella, MD 03/26/14 [phone]

## 2014-03-22 NOTE — ED Notes (Signed)
Correction to where the pt was seen earlier today [facility]  Information given by gems..  Dx constipation

## 2014-03-22 NOTE — ED Notes (Signed)
Upper and lower dentures replaced in his mouth.  Watch ring  Given to family.   Pants shirt suspenders and belt

## 2014-03-22 NOTE — ED Notes (Signed)
Brian [name] service contacted not a candidate reference number 03/02/[id number]  Brian [name] donor service.  Family has his personal belongings listed in the chart.  Given to the pts son Brian [name].  [address] [place]  [address]  .  Brian HuskyMike thurmans number [phone]

## 2014-03-22 NOTE — ED Notes (Signed)
cpr since 2120

## 2014-03-22 NOTE — ED Notes (Signed)
2 wallets  Keys flashlight  315.o5  Dollars  Given to family

## 2014-03-22 NOTE — ED Notes (Signed)
Bed control notified  Of body almost in the morgue

## 2014-03-31 NOTE — ED Notes (Signed)
To the morgue at [facility]0010

## 2014-03-31 NOTE — Progress Notes (Signed)
Chaplain responded to CPR in progress.  Chaplain escorted family to consult room and provided support to family as they were informed of pt's passing.  Chaplain provided emotional and spiritual support as well as ministry of presence and prayer.     09-10-13 2230  Clinical Encounter Type  Visited With Family;Patient not available;Health care provider  Visit Type Initial;Spiritual support;Social support;Critical Care;Death;ED  Referral From Nurse  Spiritual Encounters  Spiritual Needs Prayer;Ritual;Emotional;Grief support  Stress Factors  Patient Stress Factors None identified  Family Stress Factors Exhausted;Family relationships;Health changes;Loss  Blain Paisvercash, Yanky Vanderburg A, Chaplain

## 2014-03-31 DEATH — deceased

## 2014-05-14 ENCOUNTER — Ambulatory Visit: Payer: Medicare Other | Admitting: Neurology

## 2014-07-01 ENCOUNTER — Encounter (INDEPENDENT_AMBULATORY_CARE_PROVIDER_SITE_OTHER): Payer: Medicare Other | Admitting: Ophthalmology
